# Patient Record
Sex: Female | Born: 1935 | Race: White | Hispanic: No | Marital: Single | Smoking: Former smoker
Health system: Southern US, Community
[De-identification: ages and names within clinical notes are randomized; demographics above are authoritative.]

## PROBLEM LIST (undated history)

## (undated) DIAGNOSIS — K219 Gastro-esophageal reflux disease without esophagitis: Secondary | ICD-10-CM

## (undated) DIAGNOSIS — I4891 Unspecified atrial fibrillation: Secondary | ICD-10-CM

## (undated) DIAGNOSIS — F0280 Dementia in other diseases classified elsewhere without behavioral disturbance: Secondary | ICD-10-CM

## (undated) DIAGNOSIS — I10 Essential (primary) hypertension: Secondary | ICD-10-CM

## (undated) DIAGNOSIS — I63331 Cerebral infarction due to thrombosis of right posterior cerebral artery: Secondary | ICD-10-CM

## (undated) DIAGNOSIS — E785 Hyperlipidemia, unspecified: Secondary | ICD-10-CM

---

## 2011-12-23 DIAGNOSIS — H401190 Primary open-angle glaucoma, unspecified eye, stage unspecified: Secondary | ICD-10-CM | POA: Insufficient documentation

## 2011-12-23 DIAGNOSIS — Z961 Presence of intraocular lens: Secondary | ICD-10-CM | POA: Insufficient documentation

## 2014-04-26 DIAGNOSIS — M1710 Unilateral primary osteoarthritis, unspecified knee: Secondary | ICD-10-CM | POA: Insufficient documentation

## 2014-04-26 DIAGNOSIS — E039 Hypothyroidism, unspecified: Secondary | ICD-10-CM | POA: Insufficient documentation

## 2014-04-26 DIAGNOSIS — M179 Osteoarthritis of knee, unspecified: Secondary | ICD-10-CM | POA: Insufficient documentation

## 2014-06-12 DIAGNOSIS — E785 Hyperlipidemia, unspecified: Secondary | ICD-10-CM | POA: Insufficient documentation

## 2015-03-22 DIAGNOSIS — H353190 Nonexudative age-related macular degeneration, unspecified eye, stage unspecified: Secondary | ICD-10-CM | POA: Insufficient documentation

## 2015-04-03 DIAGNOSIS — M8580 Other specified disorders of bone density and structure, unspecified site: Secondary | ICD-10-CM | POA: Insufficient documentation

## 2015-04-11 DIAGNOSIS — R7301 Impaired fasting glucose: Secondary | ICD-10-CM | POA: Insufficient documentation

## 2015-05-06 DIAGNOSIS — H04123 Dry eye syndrome of bilateral lacrimal glands: Secondary | ICD-10-CM | POA: Insufficient documentation

## 2016-06-17 DIAGNOSIS — I447 Left bundle-branch block, unspecified: Secondary | ICD-10-CM | POA: Insufficient documentation

## 2016-07-24 DIAGNOSIS — H1013 Acute atopic conjunctivitis, bilateral: Secondary | ICD-10-CM | POA: Insufficient documentation

## 2016-08-27 DIAGNOSIS — I2510 Atherosclerotic heart disease of native coronary artery without angina pectoris: Secondary | ICD-10-CM | POA: Insufficient documentation

## 2016-09-08 DIAGNOSIS — I480 Paroxysmal atrial fibrillation: Secondary | ICD-10-CM | POA: Insufficient documentation

## 2016-09-18 DIAGNOSIS — I739 Peripheral vascular disease, unspecified: Secondary | ICD-10-CM | POA: Insufficient documentation

## 2016-11-12 DIAGNOSIS — I119 Hypertensive heart disease without heart failure: Secondary | ICD-10-CM | POA: Insufficient documentation

## 2017-02-09 DIAGNOSIS — Z9189 Other specified personal risk factors, not elsewhere classified: Secondary | ICD-10-CM | POA: Insufficient documentation

## 2017-02-10 DIAGNOSIS — S32009D Unspecified fracture of unspecified lumbar vertebra, subsequent encounter for fracture with routine healing: Secondary | ICD-10-CM | POA: Insufficient documentation

## 2017-03-05 DIAGNOSIS — I10 Essential (primary) hypertension: Secondary | ICD-10-CM | POA: Insufficient documentation

## 2017-08-20 DIAGNOSIS — D509 Iron deficiency anemia, unspecified: Secondary | ICD-10-CM | POA: Insufficient documentation

## 2017-12-17 DIAGNOSIS — H52223 Regular astigmatism, bilateral: Secondary | ICD-10-CM | POA: Insufficient documentation

## 2018-01-03 DIAGNOSIS — H524 Presbyopia: Secondary | ICD-10-CM | POA: Insufficient documentation

## 2018-05-24 DIAGNOSIS — I21A1 Myocardial infarction type 2: Secondary | ICD-10-CM | POA: Insufficient documentation

## 2019-04-05 DIAGNOSIS — I639 Cerebral infarction, unspecified: Secondary | ICD-10-CM | POA: Insufficient documentation

## 2019-04-30 DIAGNOSIS — Z7189 Other specified counseling: Secondary | ICD-10-CM | POA: Insufficient documentation

## 2019-05-31 DIAGNOSIS — R54 Age-related physical debility: Secondary | ICD-10-CM | POA: Insufficient documentation

## 2019-11-28 DIAGNOSIS — Z9181 History of falling: Secondary | ICD-10-CM | POA: Insufficient documentation

## 2019-11-28 DIAGNOSIS — G3184 Mild cognitive impairment, so stated: Secondary | ICD-10-CM | POA: Insufficient documentation

## 2019-11-28 DIAGNOSIS — H5440 Blindness, one eye, unspecified eye: Secondary | ICD-10-CM | POA: Insufficient documentation

## 2019-12-20 DIAGNOSIS — M11839 Other specified crystal arthropathies, unspecified wrist: Secondary | ICD-10-CM | POA: Insufficient documentation

## 2020-06-17 ENCOUNTER — Ambulatory Visit: Payer: Medicare Other | Admitting: Podiatry

## 2020-06-22 HISTORY — PX: ORIF TIBIA & FIBULA FRACTURES: SHX2131

## 2020-08-05 ENCOUNTER — Ambulatory Visit: Payer: Medicare Other | Admitting: Podiatry

## 2020-08-09 ENCOUNTER — Encounter: Payer: Self-pay | Admitting: Sports Medicine

## 2020-08-09 ENCOUNTER — Other Ambulatory Visit: Payer: Self-pay

## 2020-08-09 ENCOUNTER — Ambulatory Visit: Payer: Medicare Other | Admitting: Sports Medicine

## 2020-08-09 DIAGNOSIS — M79674 Pain in right toe(s): Secondary | ICD-10-CM | POA: Diagnosis not present

## 2020-08-09 DIAGNOSIS — H5440 Blindness, one eye, unspecified eye: Secondary | ICD-10-CM | POA: Diagnosis not present

## 2020-08-09 DIAGNOSIS — Z9181 History of falling: Secondary | ICD-10-CM

## 2020-08-09 DIAGNOSIS — B351 Tinea unguium: Secondary | ICD-10-CM | POA: Diagnosis not present

## 2020-08-09 DIAGNOSIS — M79675 Pain in left toe(s): Secondary | ICD-10-CM

## 2020-08-09 NOTE — Progress Notes (Signed)
Subjective: Tammy Jones is a 87 y.o. female patient seen today in office with complaint of mildly painful thickened and elongated toenails; unable to trim. Patient denies history of Diabetes, Neuropathy, or Vascular disease on ASA therapy. Patient has no other pedal complaints at this time.   Patient Active Problem List   Diagnosis Date Noted  . 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 At high risk for falls 11/28/2019  . 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 MCI (mild cognitive impairment) with memory loss 11/28/2019  . Frailty 05/31/2019  . ACP (advance care planning) 04/30/2019  . Occipital stroke (HCC) 04/05/2019  . Myocardial infarction type 2 (HCC) 05/24/2018  . Presbyopia 01/03/2018  . Regular astigmatism of both eyes 12/17/2017  . Iron deficiency anemia 08/20/2017  . Essential hypertension with goal blood pressure less than 130/80 03/05/2017  . Closed fracture of transverse process of lumbar vertebra with routine healing 02/10/2017  . At risk for bleeding associated with anticoagulants 02/09/2017  . Hypertensive heart disease without CHF 11/12/2016  . Peripheral arterial disease (HCC) 09/18/2016  . Paroxysmal atrial fibrillation (HCC) 09/08/2016  . Coronary artery disease involving native coronary artery of native heart without angina pectoris 08/27/2016  . Allergic conjunctivitis of both eyes 07/24/2016  . LBBB (left bundle branch block) 06/17/2016  . Bilateral dry eyes 05/06/2015  . Impaired fasting glucose 04/11/2015  . Osteopenia 04/03/2015  . Nonexudative age-related macular degeneration 03/22/2015  . Hyperlipidemia 06/12/2014  . Acquired hypothyroidism 04/26/2014  . Osteoarthritis of knee 04/26/2014  . Primary open-angle glaucoma 12/23/2011  . Pseudophakia 12/23/2011    Current Outpatient Medications on File Prior to Visit  Medication Sig Dispense Refill  . aspirin 325 MG EC tablet Take by mouth.    Marland Kitchen atorvastatin (LIPITOR) 40 MG tablet Take by  mouth.    . brimonidine (ALPHAGAN) 0.2 % ophthalmic solution Apply to eye.    . carvedilol (COREG) 3.125 MG tablet Take by mouth.    . cyclobenzaprine (FLEXERIL) 10 MG tablet Take by mouth.    . diclofenac Sodium (VOLTAREN) 1 % GEL Apply 2 grams to wrist pain twice daily    . diltiazem (TIAZAC) 180 MG 24 hr capsule Take by mouth.    . dorzolamide-timolol (COSOPT) 22.3-6.8 MG/ML ophthalmic solution Apply to eye.    . escitalopram (LEXAPRO) 5 MG tablet Take 1 tablet by mouth daily.    . ferrous sulfate 325 (65 FE) MG tablet Take 1 tablet by mouth daily.    Marland Kitchen gabapentin (NEURONTIN) 100 MG capsule Take 1 capsule by mouth at bedtime.    Marland Kitchen levothyroxine (SYNTHROID) 75 MCG tablet Take by mouth.    . losartan (COZAAR) 100 MG tablet Take 1 tablet by mouth daily.    . pantoprazole (PROTONIX) 40 MG tablet Take 1 tablet by mouth daily.    . sucralfate (CARAFATE) 1 g tablet Take by mouth.    . diltiazem (CARDIZEM CD) 240 MG 24 hr capsule Take 240 mg by mouth daily.     No current facility-administered medications on file prior to visit.    Allergies  Allergen Reactions  . Apixaban Diarrhea  . Hydrocodone Nausea And Vomiting  . Morphine Other (See Comments)    Hallucinations. Hallucinations.   . Other Rash    LOBSTER    Objective: Physical Exam  General: Well developed, nourished, no acute distress, awake, alert and oriented x 3  Vascular: Dorsalis pedis artery 1/4 bilateral, Posterior tibial artery 1/4 bilateral, skin temperature warm to warm proximal  to distal bilateral lower extremities, + varicosities, no pedal hair present bilateral.  Neurological: Gross sensation present via light touch bilateral.   Dermatological: Skin is warm, dry, and supple bilateral, Nails 1-10 are tender, long, thick, and discolored with mild subungal debris, no webspace macerations present bilateral, no open lesions present bilateral, no callus/corns/hyperkeratotic tissue present bilateral. No signs of  infection bilateral.  Musculoskeletal: Asymptomatic hammertoe boney deformities noted bilateral. Muscular strength within normal limits without painon range of motion. No pain with calf compression bilateral.  Assessment and Plan:  Problem List Items Addressed This Visit      Other   At high risk for falls   Blind right eye    Other Visit Diagnoses    Pain due to onychomycosis of toenails of both feet    -  Primary      -Examined patient.  -Discussed treatment options for painful mycotic nails. -Mechanically debrided and reduced mycotic nails with sterile nail nipper and dremel nail file without incident. -Patient to return in 3 months for follow up evaluation or sooner if symptoms worsen.  Asencion Islam, DPM

## 2020-09-12 DIAGNOSIS — S82852A Displaced trimalleolar fracture of left lower leg, initial encounter for closed fracture: Secondary | ICD-10-CM | POA: Diagnosis not present

## 2020-09-12 DIAGNOSIS — I480 Paroxysmal atrial fibrillation: Secondary | ICD-10-CM | POA: Diagnosis not present

## 2020-09-12 DIAGNOSIS — I6389 Other cerebral infarction: Secondary | ICD-10-CM | POA: Diagnosis not present

## 2020-09-12 DIAGNOSIS — F0390 Unspecified dementia without behavioral disturbance: Secondary | ICD-10-CM

## 2020-09-13 DIAGNOSIS — S82852A Displaced trimalleolar fracture of left lower leg, initial encounter for closed fracture: Secondary | ICD-10-CM | POA: Diagnosis not present

## 2020-09-13 DIAGNOSIS — I480 Paroxysmal atrial fibrillation: Secondary | ICD-10-CM | POA: Diagnosis not present

## 2020-09-13 DIAGNOSIS — F0390 Unspecified dementia without behavioral disturbance: Secondary | ICD-10-CM | POA: Diagnosis not present

## 2020-09-13 DIAGNOSIS — I6389 Other cerebral infarction: Secondary | ICD-10-CM | POA: Diagnosis not present

## 2020-09-14 DIAGNOSIS — I10 Essential (primary) hypertension: Secondary | ICD-10-CM | POA: Diagnosis not present

## 2020-09-14 DIAGNOSIS — S82852A Displaced trimalleolar fracture of left lower leg, initial encounter for closed fracture: Secondary | ICD-10-CM | POA: Diagnosis not present

## 2020-09-14 DIAGNOSIS — I6389 Other cerebral infarction: Secondary | ICD-10-CM

## 2020-09-14 DIAGNOSIS — I480 Paroxysmal atrial fibrillation: Secondary | ICD-10-CM

## 2020-09-22 ENCOUNTER — Emergency Department (HOSPITAL_COMMUNITY): Payer: Medicare Other

## 2020-09-22 ENCOUNTER — Encounter (HOSPITAL_COMMUNITY): Payer: Self-pay

## 2020-09-22 ENCOUNTER — Emergency Department (HOSPITAL_BASED_OUTPATIENT_CLINIC_OR_DEPARTMENT_OTHER): Payer: Medicare Other

## 2020-09-22 ENCOUNTER — Inpatient Hospital Stay (HOSPITAL_COMMUNITY)
Admission: EM | Admit: 2020-09-22 | Discharge: 2020-09-27 | DRG: 176 | Disposition: A | Discharge status: Skilled Nursing Facility | Payer: Medicare Other | Attending: Family Medicine | Admitting: Family Medicine

## 2020-09-22 DIAGNOSIS — I2693 Single subsegmental pulmonary embolism without acute cor pulmonale: Secondary | ICD-10-CM | POA: Diagnosis not present

## 2020-09-22 DIAGNOSIS — Z66 Do not resuscitate: Secondary | ICD-10-CM | POA: Diagnosis present

## 2020-09-22 DIAGNOSIS — Z888 Allergy status to other drugs, medicaments and biological substances status: Secondary | ICD-10-CM

## 2020-09-22 DIAGNOSIS — Z833 Family history of diabetes mellitus: Secondary | ICD-10-CM

## 2020-09-22 DIAGNOSIS — S82892A Other fracture of left lower leg, initial encounter for closed fracture: Secondary | ICD-10-CM

## 2020-09-22 DIAGNOSIS — Z7982 Long term (current) use of aspirin: Secondary | ICD-10-CM

## 2020-09-22 DIAGNOSIS — K219 Gastro-esophageal reflux disease without esophagitis: Secondary | ICD-10-CM | POA: Diagnosis present

## 2020-09-22 DIAGNOSIS — G309 Alzheimer's disease, unspecified: Secondary | ICD-10-CM | POA: Diagnosis not present

## 2020-09-22 DIAGNOSIS — Z79899 Other long term (current) drug therapy: Secondary | ICD-10-CM

## 2020-09-22 DIAGNOSIS — I639 Cerebral infarction, unspecified: Secondary | ICD-10-CM

## 2020-09-22 DIAGNOSIS — I447 Left bundle-branch block, unspecified: Secondary | ICD-10-CM | POA: Diagnosis present

## 2020-09-22 DIAGNOSIS — I2699 Other pulmonary embolism without acute cor pulmonale: Secondary | ICD-10-CM | POA: Diagnosis not present

## 2020-09-22 DIAGNOSIS — I480 Paroxysmal atrial fibrillation: Secondary | ICD-10-CM | POA: Diagnosis present

## 2020-09-22 DIAGNOSIS — E039 Hypothyroidism, unspecified: Secondary | ICD-10-CM | POA: Diagnosis present

## 2020-09-22 DIAGNOSIS — W19XXXD Unspecified fall, subsequent encounter: Secondary | ICD-10-CM | POA: Diagnosis present

## 2020-09-22 DIAGNOSIS — M79662 Pain in left lower leg: Secondary | ICD-10-CM | POA: Diagnosis not present

## 2020-09-22 DIAGNOSIS — F0281 Dementia in other diseases classified elsewhere with behavioral disturbance: Secondary | ICD-10-CM | POA: Diagnosis present

## 2020-09-22 DIAGNOSIS — R410 Disorientation, unspecified: Secondary | ICD-10-CM | POA: Diagnosis not present

## 2020-09-22 DIAGNOSIS — Z91013 Allergy to seafood: Secondary | ICD-10-CM

## 2020-09-22 DIAGNOSIS — Z20822 Contact with and (suspected) exposure to covid-19: Secondary | ICD-10-CM | POA: Diagnosis present

## 2020-09-22 DIAGNOSIS — S8252XD Displaced fracture of medial malleolus of left tibia, subsequent encounter for closed fracture with routine healing: Secondary | ICD-10-CM

## 2020-09-22 DIAGNOSIS — F0391 Unspecified dementia with behavioral disturbance: Secondary | ICD-10-CM | POA: Diagnosis present

## 2020-09-22 DIAGNOSIS — H539 Unspecified visual disturbance: Secondary | ICD-10-CM | POA: Diagnosis present

## 2020-09-22 DIAGNOSIS — I82442 Acute embolism and thrombosis of left tibial vein: Secondary | ICD-10-CM | POA: Diagnosis not present

## 2020-09-22 DIAGNOSIS — Z880 Allergy status to penicillin: Secondary | ICD-10-CM

## 2020-09-22 DIAGNOSIS — I69398 Other sequelae of cerebral infarction: Secondary | ICD-10-CM

## 2020-09-22 DIAGNOSIS — Z885 Allergy status to narcotic agent status: Secondary | ICD-10-CM

## 2020-09-22 DIAGNOSIS — Z79890 Hormone replacement therapy (postmenopausal): Secondary | ICD-10-CM

## 2020-09-22 DIAGNOSIS — G3184 Mild cognitive impairment, so stated: Secondary | ICD-10-CM

## 2020-09-22 DIAGNOSIS — F03918 Unspecified dementia, unspecified severity, with other behavioral disturbance: Secondary | ICD-10-CM | POA: Diagnosis present

## 2020-09-22 DIAGNOSIS — R4182 Altered mental status, unspecified: Secondary | ICD-10-CM | POA: Diagnosis not present

## 2020-09-22 DIAGNOSIS — Z87891 Personal history of nicotine dependence: Secondary | ICD-10-CM

## 2020-09-22 DIAGNOSIS — E785 Hyperlipidemia, unspecified: Secondary | ICD-10-CM | POA: Diagnosis present

## 2020-09-22 DIAGNOSIS — I10 Essential (primary) hypertension: Secondary | ICD-10-CM | POA: Diagnosis present

## 2020-09-22 HISTORY — DX: Gastro-esophageal reflux disease without esophagitis: K21.9

## 2020-09-22 HISTORY — DX: Hyperlipidemia, unspecified: E78.5

## 2020-09-22 HISTORY — DX: Essential (primary) hypertension: I10

## 2020-09-22 HISTORY — DX: Dementia in other diseases classified elsewhere, unspecified severity, without behavioral disturbance, psychotic disturbance, mood disturbance, and anxiety: F02.80

## 2020-09-22 HISTORY — DX: Cerebral infarction due to thrombosis of right posterior cerebral artery: I63.331

## 2020-09-22 HISTORY — DX: Unspecified atrial fibrillation: I48.91

## 2020-09-22 LAB — URINALYSIS, ROUTINE W REFLEX MICROSCOPIC
Bilirubin Urine: NEGATIVE
Glucose, UA: NEGATIVE mg/dL
Ketones, ur: NEGATIVE mg/dL
Nitrite: NEGATIVE
Protein, ur: NEGATIVE mg/dL
Specific Gravity, Urine: 1.018 (ref 1.005–1.030)
pH: 6 (ref 5.0–8.0)

## 2020-09-22 LAB — CBC WITH DIFFERENTIAL/PLATELET
Abs Immature Granulocytes: 0.02 10*3/uL (ref 0.00–0.07)
Basophils Absolute: 0 10*3/uL (ref 0.0–0.1)
Basophils Relative: 1 %
Eosinophils Absolute: 0 10*3/uL (ref 0.0–0.5)
Eosinophils Relative: 0 %
HCT: 29 % — ABNORMAL LOW (ref 36.0–46.0)
Hemoglobin: 9.3 g/dL — ABNORMAL LOW (ref 12.0–15.0)
Immature Granulocytes: 0 %
Lymphocytes Relative: 14 %
Lymphs Abs: 0.9 10*3/uL (ref 0.7–4.0)
MCH: 26.6 pg (ref 26.0–34.0)
MCHC: 32.1 g/dL (ref 30.0–36.0)
MCV: 83.1 fL (ref 80.0–100.0)
Monocytes Absolute: 0.8 10*3/uL (ref 0.1–1.0)
Monocytes Relative: 12 %
Neutro Abs: 5 10*3/uL (ref 1.7–7.7)
Neutrophils Relative %: 73 %
Platelets: 345 10*3/uL (ref 150–400)
RBC: 3.49 MIL/uL — ABNORMAL LOW (ref 3.87–5.11)
RDW: 15.9 % — ABNORMAL HIGH (ref 11.5–15.5)
WBC: 6.8 10*3/uL (ref 4.0–10.5)
nRBC: 0 % (ref 0.0–0.2)

## 2020-09-22 LAB — COMPREHENSIVE METABOLIC PANEL
ALT: 30 U/L (ref 0–44)
AST: 32 U/L (ref 15–41)
Albumin: 2.1 g/dL — ABNORMAL LOW (ref 3.5–5.0)
Alkaline Phosphatase: 97 U/L (ref 38–126)
Anion gap: 7 (ref 5–15)
BUN: 12 mg/dL (ref 8–23)
CO2: 23 mmol/L (ref 22–32)
Calcium: 8.1 mg/dL — ABNORMAL LOW (ref 8.9–10.3)
Chloride: 105 mmol/L (ref 98–111)
Creatinine, Ser: 0.88 mg/dL (ref 0.44–1.00)
GFR, Estimated: >60 mL/min (ref >60)
Glucose, Bld: 125 mg/dL — ABNORMAL HIGH (ref 70–99)
Potassium: 3.6 mmol/L (ref 3.5–5.1)
Sodium: 135 mmol/L (ref 135–145)
Total Bilirubin: 0.7 mg/dL (ref 0.3–1.2)
Total Protein: 6.1 g/dL — ABNORMAL LOW (ref 6.5–8.1)

## 2020-09-22 LAB — LACTIC ACID, PLASMA: Lactic Acid, Venous: 0.9 mmol/L (ref 0.5–1.9)

## 2020-09-22 LAB — CBG MONITORING, ED: Glucose-Capillary: 125 mg/dL — ABNORMAL HIGH (ref 70–99)

## 2020-09-22 LAB — D-DIMER, QUANTITATIVE: D-Dimer, Quant: 3.81 ug/mL-FEU — ABNORMAL HIGH (ref 0.00–0.50)

## 2020-09-22 LAB — TROPONIN I (HIGH SENSITIVITY)
Troponin I (High Sensitivity): 9 ng/L (ref <18)
Troponin I (High Sensitivity): 9 ng/L (ref <18)

## 2020-09-22 MED ORDER — IOHEXOL 350 MG/ML SOLN
75.0000 mL | Freq: Once | INTRAVENOUS | Status: AC | PRN
  Administered 2020-09-22: 75 mL via INTRAVENOUS

## 2020-09-22 MED ORDER — ESCITALOPRAM OXALATE 10 MG PO TABS
5.0000 mg | ORAL_TABLET | Freq: Every morning | ORAL | Status: DC
  Administered 2020-09-23 – 2020-09-27 (×5): 5 mg via ORAL
  Filled 2020-09-22 (×5): qty 1

## 2020-09-22 MED ORDER — CLOPIDOGREL BISULFATE 75 MG PO TABS: 75.0000 mg | ORAL_TABLET | Freq: Every morning | ORAL | Status: DC

## 2020-09-22 MED ORDER — ASPIRIN EC 81 MG PO TBEC: 81.0000 mg | DELAYED_RELEASE_TABLET | Freq: Every morning | ORAL | Status: DC

## 2020-09-22 MED ORDER — APIXABAN 5 MG PO TABS
5.0000 mg | ORAL_TABLET | Freq: Once | ORAL | Status: AC
  Administered 2020-09-22: 5 mg via ORAL
  Filled 2020-09-22: qty 1

## 2020-09-22 MED ORDER — DILTIAZEM HCL ER COATED BEADS 240 MG PO CP24
240.0000 mg | ORAL_CAPSULE | Freq: Every morning | ORAL | Status: DC
  Administered 2020-09-23 – 2020-09-27 (×5): 240 mg via ORAL
  Filled 2020-09-22 (×7): qty 1

## 2020-09-22 MED ORDER — ACETAMINOPHEN 650 MG RE SUPP: 650.0000 mg | Freq: Four times a day (QID) | RECTAL | Status: DC | PRN

## 2020-09-22 MED ORDER — APIXABAN 5 MG PO TABS
10.0000 mg | ORAL_TABLET | Freq: Two times a day (BID) | ORAL | Status: DC
  Administered 2020-09-23 – 2020-09-27 (×9): 10 mg via ORAL
  Filled 2020-09-22 (×9): qty 2

## 2020-09-22 MED ORDER — ATORVASTATIN CALCIUM 40 MG PO TABS
40.0000 mg | ORAL_TABLET | Freq: Every day | ORAL | Status: DC
  Administered 2020-09-22 – 2020-09-27 (×6): 40 mg via ORAL
  Filled 2020-09-22 (×2): qty 1
  Filled 2020-09-22: qty 4
  Filled 2020-09-22 (×3): qty 1

## 2020-09-22 MED ORDER — APIXABAN 2.5 MG PO TABS: 2.5000 mg | ORAL_TABLET | Freq: Two times a day (BID) | ORAL | Status: DC

## 2020-09-22 MED ORDER — APIXABAN 5 MG PO TABS: 5.0000 mg | ORAL_TABLET | Freq: Two times a day (BID) | ORAL | Status: DC

## 2020-09-22 MED ORDER — LEVOTHYROXINE SODIUM 75 MCG PO TABS
75.0000 ug | ORAL_TABLET | Freq: Every day | ORAL | Status: DC
  Administered 2020-09-23 – 2020-09-27 (×5): 75 ug via ORAL
  Filled 2020-09-22 (×5): qty 1

## 2020-09-22 MED ORDER — LORAZEPAM 2 MG/ML IJ SOLN: 1.0000 mg | Freq: Once | INTRAMUSCULAR | Status: DC | PRN

## 2020-09-22 MED ORDER — ACETAMINOPHEN 325 MG PO TABS
650.0000 mg | ORAL_TABLET | Freq: Four times a day (QID) | ORAL | Status: DC | PRN
  Administered 2020-09-23 – 2020-09-26 (×6): 650 mg via ORAL
  Filled 2020-09-22 (×6): qty 2

## 2020-09-22 MED ORDER — APIXABAN 5 MG PO TABS
5.0000 mg | ORAL_TABLET | Freq: Two times a day (BID) | ORAL | Status: DC
  Administered 2020-09-22: 5 mg via ORAL
  Filled 2020-09-22: qty 1

## 2020-09-22 MED ORDER — BRIMONIDINE TARTRATE 0.2 % OP SOLN
1.0000 [drp] | Freq: Every morning | OPHTHALMIC | Status: DC
  Administered 2020-09-23 – 2020-09-27 (×5): 1 [drp] via OPHTHALMIC
  Filled 2020-09-22: qty 5

## 2020-09-22 MED ORDER — PANTOPRAZOLE SODIUM 40 MG PO TBEC
40.0000 mg | DELAYED_RELEASE_TABLET | Freq: Every morning | ORAL | Status: DC
  Administered 2020-09-23 – 2020-09-27 (×5): 40 mg via ORAL
  Filled 2020-09-22 (×5): qty 1

## 2020-09-22 MED ORDER — GABAPENTIN 100 MG PO CAPS
100.0000 mg | ORAL_CAPSULE | Freq: Every morning | ORAL | Status: DC
  Administered 2020-09-23 – 2020-09-27 (×5): 100 mg via ORAL
  Filled 2020-09-22 (×5): qty 1

## 2020-09-22 MED ORDER — SUCRALFATE 1 GM/10ML PO SUSP
1.0000 g | Freq: Two times a day (BID) | ORAL | Status: DC
  Administered 2020-09-22 – 2020-09-27 (×10): 1 g via ORAL
  Filled 2020-09-22 (×11): qty 10

## 2020-09-22 MED ORDER — LOSARTAN POTASSIUM 50 MG PO TABS
100.0000 mg | ORAL_TABLET | Freq: Every morning | ORAL | Status: DC
  Administered 2020-09-23 – 2020-09-27 (×5): 100 mg via ORAL
  Filled 2020-09-22 (×5): qty 2

## 2020-09-22 MED ORDER — VITAMIN D 25 MCG (1000 UNIT) PO TABS
50000.0000 [IU] | ORAL_TABLET | ORAL | Status: DC
  Filled 2020-09-22: qty 50

## 2020-09-22 MED ORDER — ONDANSETRON HCL 4 MG PO TABS: 4.0000 mg | ORAL_TABLET | Freq: Four times a day (QID) | ORAL | Status: DC | PRN

## 2020-09-22 MED ORDER — DORZOLAMIDE HCL-TIMOLOL MAL 2-0.5 % OP SOLN
1.0000 [drp] | Freq: Every morning | OPHTHALMIC | Status: DC
  Administered 2020-09-23 – 2020-09-27 (×5): 1 [drp] via OPHTHALMIC
  Filled 2020-09-22 (×2): qty 10

## 2020-09-22 MED ORDER — ONDANSETRON HCL 4 MG/2ML IJ SOLN: 4.0000 mg | Freq: Four times a day (QID) | INTRAMUSCULAR | Status: DC | PRN

## 2020-09-22 NOTE — ED Provider Notes (Signed)
  Physical Exam  BP (!) 145/65 (BP Location: Right Arm)   Pulse 66   Temp 98.5 F (36.9 C) (Oral)   Resp 12   SpO2 98%   Physical Exam  ED Course/Procedures   Clinical Course as of 09/22/20 2209  Sun Sep 22, 2020  2013 I spent quite some time talking to the patient's daughter.  I assured her that we had done a thorough job of evaluating the patient for any acute medical issues that could be contributing to delirium.  I think the patient's delirium is likely secondary to her recent surgery and also progression of dementia.  The patient is also in a new environment, and this could be contributing to her symptoms.  Nonetheless, the daughter is very worried about the patient's symptoms as she has been hallucinating.  She does not believe the skilled nursing facility has the capacity to fully monitor, evaluate, and treat the symptoms.  The next step in this case, would be to consult behavioral health for recommendations. [AW]    Clinical Course User Index [AW] Koleen Distance, MD    Procedures  MDM   Received care of patient from Dr. Delford Field.  Please see her note for prior history, physical and care.  Briefly this is a 87 year old female with a history of Alzheimer's, atrial fibrillation, CVA hypertension, hyperlipidemia, who presents with concern for hallucinations.  She has had a thorough evaluation including CT head, DVT study which showed evolution of known infarct, and DVT study positive for thrombus.  Agree that it is very likely her delirium is secondary to progression of her dementia in the setting of recent stroke, new environment.  Given her positive D-dimer, and intermittent changes in oxygenation on differential for cause of altered mental status, ordered a CT PE study for further evaluation.  CT shows small PE without evidence of right heart strain.  She has already received Eliquis, has no sign of right heart strain.  Will admit to the hospitalist for echo and monitoring for other  vital sign changes and possibility that this contributes to delirium, however it is likely her current symptoms have multifactorial origins including baseline dementia, recent hospitalizations, new environments and stroke.      Alvira Monday, MD 09/23/20 [phone]

## 2020-09-22 NOTE — BH Assessment (Addendum)
Comprehensive Clinical Assessment (CCA) Note  09/22/2020 Tammy Jones [id number]  Recommendations for Services/Supports/Treatments: Melbourne Abts, PA, reviewed pt's chart and information and determined pt should be observed overnight for safety and stability and re-assessed in the morning by psychiatry. Pt will remain at [facility] for observation, as the [facility] has no appropriate bed for pt. Pt's EDP, Dr. Dalene Seltzer, and nurse, Tammy Bonds RN, were relayed this information via secure internal IM at 2149.  The patient demonstrates the following risk factors for suicide: Chronic risk factors for suicide include: medical illness of dementia. Acute risk factors for suicide include: pt placement at a Skilled Living Facility. Protective factors for this patient include: positive social support, positive therapeutic relationship and coping skills. Considering these factors, the overall suicide risk at this point appears to be none. Patient is not appropriate for outpatient follow up.  Therefore, no sitter for suicide precautions are identified at this time.  Flowsheet Row ED from 09/22/2020 in [facility] EMERGENCY DEPARTMENT  C-SSRS RISK CATEGORY No Risk      Chief Complaint:  Chief Complaint  Patient presents with  . Altered Mental Status   Visit Diagnosis: F05, Delirium due to another medical condition  CCA Screening, Triage and Referral (STR) Tammy Jones is an 87 year old patient who was brought to the [facility] via EMS after her daughter called 911 due to concerns re: her mother acting differently than she usually. Pt has been dx with dementia and she shares she's been experiencing AVH since her last series of strokes. Pt states, "I have spells where I don't remember stuff." Of the AVH, when asked if they were bothering her, she states, "they're just the nicest people."  Pt shares she lives with her daughter. She identifies she recently fell and broke part of her leg or ankle. When asked  how her progress with recovery has been going, she stated, "hopefully it's doing ok; I've got stitches up the wazoo." Pt was able to identify that her daughter lives close to the Skilled Nursing Facility she currently resides in; she states her daughter visits frequently but was unable to identify who visited her today (it was her daughter, who called 911 due to concerns re: pt's dementia).  Pt denies SI or any hx of SI. She denies any prior attempts to kill herself or any current plan to kill herself. Pt denies HI, NSSIB, access to guns/weapons, engagement with the legal system, or SA. Pt confirms she's experiencing "a little bit of both" audio and visual hallucinations. She shares this has been ongoing since her second series of strokes.  Pt was able to identify her name and why she was in the hospital. When asked, she stated she thought today was Nov 11, 2018, though she acknowledged this was "just a wild guess." When asked where she currently was, pt stated, "I know it's [place]." When clinician asked again a moment later, pt stated she thought she was in [place], [place]. Pt was unable to identify who the President of the [place] currently is.   Patient Reported Information How did you hear about [place]? Other (Comment) (EDP)  Referral name: EDP  Referral phone number: 0 (N/A)   Whom do you see for routine medical problems? -- (Not assessed)  Practice/Facility Name: No data recorded Practice/Facility Phone Number: No data recorded Name of Contact: No data recorded Contact Number: No data recorded Contact Fax Number: No data recorded Prescriber Name: No data recorded Prescriber Address (if known): No data recorded  What Is the  Reason for Your Visit/Call Today? Pt shares she believes she had another stroke. She confirms she's been experiencing AVH since her last stroke.  How Long Has This Been Causing You Problems? 1-6 months  What Do You Feel Would Help You the Most Today?  Medication(s)   Have You Recently Been in Any Inpatient Treatment (Hospital/Detox/Crisis Center/28-Day Program)? No  Name/Location of Program/Hospital:No data recorded How Long Were You There? No data recorded When Were You Discharged? No data recorded  Have You Ever Received Services From [facility] Before? Yes  Who Do You See at [employer] [facility]? DPM Stover in Podiatry for toenail pain on 08/09/2020   Have You Recently Had Any Thoughts About Hurting Yourself? No  Are You Planning to Commit Suicide/Harm Yourself At This time? No   Have you Recently Had Thoughts About Hurting Someone Karolee Ohs? No  Explanation: No data recorded  Have You Used Any Alcohol or Drugs in the Past 24 Hours? No  How Long Ago Did You Use Drugs or Alcohol? No data recorded What Did You Use and How Much? No data recorded  Do You Currently Have a Therapist/Psychiatrist? No  Name of Therapist/Psychiatrist: No data recorded  Have You Been Recently Discharged From Any Office Practice or Programs? No  Explanation of Discharge From Practice/Program: No data recorded    CCA Screening Triage Referral Assessment Type of Contact: Tele-Assessment  Is this Initial or Reassessment? Initial Assessment  Date Telepsych consult ordered in [facility]:  09/22/2020  Time Telepsych consult ordered in [facility]:  2014   Patient Reported Information Reviewed? Yes  Patient Left Without Being Seen? No data recorded Reason for Not Completing Assessment: No data recorded  Collateral Involvement: Verbal consent for collateral was not obtained.   Does Patient Have a Automotive engineer Guardian? No data recorded Name and Contact of Legal Guardian: No data recorded If Minor and Not Living with Parent(s), Who has Custody? N/A  Is CPS involved or ever been involved? Never  Is APS involved or ever been involved? Never   Patient Determined To Be At Risk for Harm To Self or Others Based on Review of Patient Reported Information or  Presenting Complaint? No  Method: No data recorded Availability of Means: No data recorded Intent: No data recorded Notification Required: No data recorded Additional Information for Danger to Others Potential: No data recorded Additional Comments for Danger to Others Potential: No data recorded Are There Guns or Other Weapons in Your Home? No data recorded Types of Guns/Weapons: No data recorded Are These Weapons Safely Secured?                            No data recorded Who Could Verify You Are Able To Have These Secured: No data recorded Do You Have any Outstanding Charges, Pending Court Dates, Parole/Probation? No data recorded Contacted To Inform of Risk of Harm To Self or Others: Other: Comment (N/A)   Location of Assessment: [facility] ED   Does Patient Present under Involuntary Commitment? No  IVC Papers Initial File Date: No data recorded  [place] of Residence: [place]   Patient Currently Receiving the Following Services: Not Receiving Services   Determination of Need: Routine (7 days)   Options For Referral: Other: Comment (Overnight observation at [facility])     CCA Biopsychosocial Intake/Chief Complaint:  Pt shares she believes she had another stroke. She confirms she's been experiencing AVH since her last stroke.  Current Symptoms/Problems: Pt shares she's  been experiencing AVH since her last series of strokes. She states, "they're just the nicest people," in regards to the hallucinations.   Patient Reported Schizophrenia/Schizoaffective Diagnosis in Past: No   Strengths: Not assessed  Preferences: Not assessed  Abilities: Not assessed   Type of Services Patient Feels are Needed: Not assessed   Initial Clinical Notes/Concerns: N/A   Mental Health Symptoms Depression:  None   Duration of Depressive symptoms: No data recorded  Mania:  None   Anxiety:   None   Psychosis:  None   Duration of Psychotic symptoms: No data recorded  Trauma:  None    Obsessions:  None   Compulsions:  None   Inattention:  None   Hyperactivity/Impulsivity:  N/A   Oppositional/Defiant Behaviors:  None   Emotional Irregularity:  None   Other Mood/Personality Symptoms:  None noted    Mental Status Exam Appearance and self-care  Stature:  Small   Weight:  Average weight   Clothing:  Neat/clean   Grooming:  Normal   Cosmetic use:  None   Posture/gait:  Normal   Motor activity:  Not Remarkable   Sensorium  Attention:  Normal   Concentration:  Normal   Orientation:  Situation; Person   Recall/memory:  Defective in Short-term; Defective in Recent; Defective in Remote   Affect and Mood  Affect:  Appropriate   Mood:  Other (Comment) (Friendly)   Relating  Eye contact:  Normal   Facial expression:  Responsive   Attitude toward examiner:  Cooperative   Thought and Language  Speech flow: Clear and Coherent   Thought content:  Appropriate to Mood and Circumstances   Preoccupation:  None   Hallucinations:  Auditory; Visual   Organization:  No data recorded  [employer] of Knowledge:  Average   Intelligence:  Average   Abstraction:  Normal   Judgement:  Good   Reality Testing:  Variable   Insight:  Good   Decision Making:  Normal   Social Functioning  Social Maturity:  Responsible   Social Judgement:  Normal   Stress  Stressors:  Housing; Illness; Transitions   Coping Ability:  Normal   Skill Deficits:  Activities of daily living; Self-care (Note: this is due to age & pt's recently broken left tibia and fibula.)   Supports:  Family; Friends/Service system     Religion: Religion/Spirituality Are You A Religious Person?:  (Not assessed) How Might This Affect Treatment?: Not assessed  Leisure/Recreation: Leisure / Recreation Do You Have Hobbies?:  (Not assessed)  Exercise/Diet: Exercise/Diet Do You Exercise?:  (Not assessed) Have You Gained or Lost A Significant Amount of Weight  in the Past Six Months?:  (Not assessed) Do You Follow a Special Diet?:  (Not assessed) Do You Have Any Trouble Sleeping?:  (Not assessed)   CCA Employment/Education Employment/Work Situation: Employment / Work Situation Employment situation: Retired Psychologist, 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 been impacted by current illness:  (N/A) What is the longest time patient has a held a job?: Not assessed Where was the patient employed at that time?: Not assessed Has patient ever been in the [employer]?:  (Not assessed)  Education: Education Is Patient Currently Attending School?: No Last Grade Completed:  (Not assessed) Name of High School: Not assessed Did Garment/textile technologist From [employer]?:  (Not assessed) Did Theme park manager?:  (Not assessed) Did You Attend Graduate School?:  (Not assessed) Did You Have Any Special Interests In School?: Not assessed Did You Have An Individualized Education Program (IIEP):  (  Not assessed) Did You Have Any Difficulty At School?:  (Not assessed) Patient's Education Has Been Impacted by Current Illness:  (Not assessed)   CCA Family/Childhood History Family and Relationship History: Family history Marital status:  (Not assessed) Are you sexually active?:  (Not assessed) What is your sexual orientation?: Not assessed Has your sexual activity been affected by drugs, alcohol, medication, or emotional stress?: Not assessed Does patient have children?: Yes How many children?: 1 (Pt has at least one child, a daughter) How is patient's relationship with their children?: Pt lives with her daughter when she is not in the Skilled Nursing Facility  Childhood History:  Childhood History By whom was/is the patient raised?:  (Not assessed) Additional childhood history information: Not assessed Description of patient's relationship with caregiver when they were a child: Not assessed Patient's description of current relationship with people who raised him/her: Not assessed How were you  disciplined when you got in trouble as a child/adolescent?: Not assessed Does patient have siblings?:  (Not assessed) Did patient suffer any verbal/emotional/physical/sexual abuse as a child?:  (Not assessed) Did patient suffer from severe childhood neglect?:  (Not assessed) Has patient ever been sexually abused/assaulted/raped as an adolescent or adult?:  (Not assessed) Was the patient ever a victim of a crime or a disaster?:  (Not assessed) Witnessed domestic violence?:  (Not assessed) Has patient been affected by domestic violence as an adult?:  (Not assessed)  Child/Adolescent Assessment:     CCA Substance Use Alcohol/Drug Use: Alcohol / Drug Use Pain Medications: Please see MAR Prescriptions: Please see MAR Over the Counter: Please see MAR History of alcohol / drug use?: No history of alcohol / drug abuse Longest period of sobriety (when/how long): N/A Negative Consequences of Use:  (N/A) Withdrawal Symptoms:  (N/A)                         ASAM's:  Six Dimensions of Multidimensional Assessment  Dimension 1:  Acute Intoxication and/or Withdrawal Potential:      Dimension 2:  Biomedical Conditions and Complications:      Dimension 3:  Emotional, Behavioral, or Cognitive Conditions and Complications:     Dimension 4:  Readiness to Change:     Dimension 5:  Relapse, Continued use, or Continued Problem Potential:     Dimension 6:  Recovery/Living Environment:     ASAM Severity Score:    ASAM Recommended Level of Treatment:     Substance use Disorder (SUD) Substance Use Disorder (SUD)  Checklist Symptoms of Substance Use:  (N/A)  Recommendations for Services/Supports/Treatments: Recommendations for Services/Supports/Treatments Recommendations For Services/Supports/Treatments: Other (Comment) (Overnight observation)  [place] Abts, PA, reviewed pt's chart and information and determined pt should be observed overnight for safety and stability and re-assessed in the  morning by psychiatry. Pt will remain at [facility] for observation, as the [facility] has no appropriate bed for pt. Pt's EDP, Dr. Dalene Seltzer, and nurse, Tammy Bonds RN, were relayed this information via secure internal IM at 2149.   DSM5 Diagnoses: Patient Active Problem List   Diagnosis Date Noted  . 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 At high risk for falls 11/28/2019  . 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 MCI (mild cognitive impairment) with memory loss 11/28/2019  . Frailty 05/31/2019  . ACP (advance care planning) 04/30/2019  . Occipital stroke (HCC) 04/05/2019  . Myocardial infarction type 2 (HCC) 05/24/2018  . Presbyopia 01/03/2018  . Regular astigmatism of both eyes 12/17/2017  .  Iron deficiency anemia 08/20/2017  . Essential hypertension with goal blood pressure less than 130/80 03/05/2017  . Closed fracture of transverse process of lumbar vertebra with routine healing 02/10/2017  . At risk for bleeding associated with anticoagulants 02/09/2017  . Hypertensive heart disease without CHF 11/12/2016  . Peripheral arterial disease (HCC) 09/18/2016  . Paroxysmal atrial fibrillation (HCC) 09/08/2016  . Coronary artery disease involving native coronary artery of native heart without angina pectoris 08/27/2016  . Allergic conjunctivitis of both eyes 07/24/2016  . LBBB (left bundle branch block) 06/17/2016  . Bilateral dry eyes 05/06/2015  . Impaired fasting glucose 04/11/2015  . Osteopenia 04/03/2015  . Nonexudative age-related macular degeneration 03/22/2015  . Hyperlipidemia 06/12/2014  . Acquired hypothyroidism 04/26/2014  . Osteoarthritis of knee 04/26/2014  . Primary open-angle glaucoma 12/23/2011  . Pseudophakia 12/23/2011    Patient Centered Plan: Patient is on the following Treatment Plan(s): none   Referrals to Alternative Service(s): Referred to Alternative Service(s):   Place:   Date:   Time:    Referred to Alternative Service(s):   Place:   Date:   Time:     Referred to Alternative Service(s):   Place:   Date:   Time:    Referred to Alternative Service(s):   Place:   Date:   Time:     Ralph DowdySamantha L Lita Flynn, LMFT

## 2020-09-22 NOTE — Progress Notes (Signed)
Lower extremity venous LT study completed.  Preliminary results relayed to Delford Field, MD.   See CV Proc for preliminary results report.   Jean Rosenthal, RDMS, RVT

## 2020-09-22 NOTE — H&P (Signed)
History and Physical    [place] YOV:[id number] DOB: December 11, 1931 DOA: 09/22/2020  PCP: Everlean Cherry, MD  Patient coming from: SNF  I have personally briefly reviewed patient's old medical records in [facility]  Chief Complaint: Hallucinations  HPI: Tammy Jones is a 87 y.o. female with medical history significant of Dementia, prior strokes, PAF not on anticoagulation anymore due to frequent falls, HTN.  3 weeks ago pt living with daughter, would ask repetitive questions and have sundowning at night however not terribly impaired from dementia standpoint.  She ambulated unassisted.  Pt then suffered mechanical fall and broke L TIB-FIB.  She was admitted to [facility] and had ORIF.  Did have some delirium in post-op course and reported visual hallucinations.  Pt discharged to SNF, had reportedly been doing well at SNF.  Was reportedly normal when granddaughter saw her last night.  This morning however, patients daughter went to visit patient and patient was talking to people in room that wernt there.  Daughter called EMS.  Pts mental status has been waxing and waning since then.  Pt denies: abd pain, SOB, fever, palpitations, rash, seizures, slurred speech.   ED Course: W/U in ED shows small pulmonary emboli on CTA in RUL.  CT head shows old strokes that correlate to the (then subacute) stroke seen back in Oct 2020.  HGB 9.3  EDP gave pt dose of eliquis.  TTS consulted, hospitalist asked to admit due to presence of PE.   Review of Systems: As per HPI, otherwise all review of systems negative.  Past Medical History:  Diagnosis Date  . Alzheimer's disease (HCC)   . Atrial fibrillation (HCC)   . 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 GERD (gastroesophageal reflux disease)   . Hyperlipidemia   . Hypertension     Past Surgical History:  Procedure Laterality Date  . ORIF TIBIA & FIBULA FRACTURES Left 2022     reports  that she has quit smoking. She has a 10.00 pack-year smoking history. She does not have any smokeless tobacco history on file. She reports that she does not drink alcohol and does not use drugs.  Allergies  Allergen Reactions  . Apixaban Diarrhea  . Hydrocodone Nausea And Vomiting  . Morphine Other (See Comments)    Hallucinations  . Penicillins Other (See Comments)    "Allergic," per MAR  . Other Rash    LOBSTER    Family History  Problem Relation Age of Onset  . Diabetes Sister   . Stroke Neg Hx      Prior to Admission medications   Medication Sig Start Date End Date Taking? Authorizing Provider  aspirin EC 81 MG tablet Take 81 mg by mouth in the morning. Swallow whole.   Yes [provider]  atorvastatin (LIPITOR) 40 MG tablet Take 40 mg by mouth daily. 06/26/19  Yes [provider]  brimonidine (ALPHAGAN) 0.2 % ophthalmic solution Place 1 drop into both eyes in the morning. 07/03/19  Yes [provider]  Cholecalciferol (VITAMIN D3) 1.25 MG (50000 UT) CAPS Take 50,000 Units by mouth every Tuesday.   Yes [provider]  clopidogrel (PLAVIX) 75 MG tablet Take 75 mg by mouth in the morning.   Yes [provider]  diltiazem (CARDIZEM LA) 240 MG 24 hr tablet Take 240 mg by mouth in the morning.   Yes [provider]  dorzolamide-timolol (COSOPT) 22.3-6.8 MG/ML ophthalmic solution Place 1 drop into both  eyes in the morning. 07/03/19  Yes [provider]  escitalopram (LEXAPRO) 5 MG tablet Take 5 mg by mouth in the morning. 02/20/20  Yes [provider]  gabapentin (NEURONTIN) 100 MG capsule Take 100 mg by mouth in the morning. 06/26/19  Yes [provider]  levothyroxine (SYNTHROID) 75 MCG tablet Take 75 mcg by mouth daily before breakfast. 07/05/19  Yes [provider]  losartan (COZAAR) 100 MG tablet Take 100 mg by mouth in the morning. 03/20/19  Yes [provider]  oxyCODONE (OXY  IR/ROXICODONE) 5 MG immediate release tablet Take 5 mg by mouth every 4 (four) hours as needed for moderate pain or severe pain.  09/22/20 Yes [provider]  pantoprazole (PROTONIX) 40 MG tablet Take 40 mg by mouth in the morning. 06/26/19  Yes [provider]  sucralfate (CARAFATE) 1 GM/10ML suspension Take 1 g by mouth in the morning and at bedtime.   Yes [provider]    Physical Exam: Vitals:   09/22/20 2215 09/22/20 2230 09/22/20 2300 09/22/20 2310  BP: (!) 148/68 (!) 147/66 (!) 163/68   Pulse: 68 63 65 69  Resp: (!) 22 20 18 18   Temp:      TempSrc:      SpO2: 97% 98% 97% 99%    Constitutional: NAD, calm, comfortable Eyes: PERRL, lids and conjunctivae normal ENMT: Mucous membranes are moist. Posterior pharynx clear of any exudate or lesions.Normal dentition.  Neck: normal, supple, no masses, no thyromegaly Respiratory: clear to auscultation bilaterally, no wheezing, no crackles. Normal respiratory effort. No accessory muscle use.  Cardiovascular: Regular rate and rhythm, no murmurs / rubs / gallops. No extremity edema. 2+ pedal pulses. No carotid bruits.  Abdomen: no tenderness, no masses palpated. No hepatosplenomegaly. Bowel sounds positive.  Musculoskeletal: no clubbing / cyanosis. No joint deformity upper and lower extremities. Good ROM, no contractures. Normal muscle tone.  Skin:    Neurologic: CN 2-12 grossly intact. Sensation intact, DTR normal. Strength 5/5 in all 4.  Psychiatric: Normal judgment and insight. Oriented to person and place.   Labs on Admission: I have personally reviewed following labs and imaging studies  CBC: Recent Labs  Lab 09/22/20 1352  WBC 6.8  NEUTROABS 5.0  HGB 9.3*  HCT 29.0*  MCV 83.1  PLT 345   Basic Metabolic Panel: Recent Labs  Lab 09/22/20 1352  NA 135  K 3.6  CL 105  CO2 23  GLUCOSE 125*  BUN 12  CREATININE 0.88  CALCIUM 8.1*   GFR: CrCl cannot be calculated (Unknown ideal  weight.). Liver Function Tests: Recent Labs  Lab 09/22/20 1352  AST 32  ALT 30  ALKPHOS 97  BILITOT 0.7  PROT 6.1*  ALBUMIN 2.1*   No results for input(s): LIPASE, AMYLASE in the last 168 hours. No results for input(s): AMMONIA in the last 168 hours. Coagulation Profile: No results for input(s): INR, PROTIME in the last 168 hours. Cardiac Enzymes: No results for input(s): CKTOTAL, CKMB, CKMBINDEX, TROPONINI in the last 168 hours. BNP (last 3 results) No results for input(s): PROBNP in the last 8760 hours. HbA1C: No results for input(s): HGBA1C in the last 72 hours. CBG: Recent Labs  Lab 09/22/20 1251  GLUCAP 125*   Lipid Profile: No results for input(s): CHOL, HDL, LDLCALC, TRIG, CHOLHDL, LDLDIRECT in the last 72 hours. Thyroid Function Tests: No results for input(s): TSH, T4TOTAL, FREET4, T3FREE, THYROIDAB in the last 72 hours. Anemia Panel: No results for input(s): VITAMINB12, FOLATE, FERRITIN,  TIBC, IRON, RETICCTPCT in the last 72 hours. Urine analysis:    Component Value Date/Time   COLORURINE YELLOW 09/22/2020 1937   APPEARANCEUR CLEAR 09/22/2020 1937   LABSPEC 1.018 09/22/2020 1937   PHURINE 6.0 09/22/2020 1937   GLUCOSEU NEGATIVE 09/22/2020 1937   HGBUR SMALL (A) 09/22/2020 1937   BILIRUBINUR NEGATIVE 09/22/2020 1937   KETONESUR NEGATIVE 09/22/2020 1937   PROTEINUR NEGATIVE 09/22/2020 1937   NITRITE NEGATIVE 09/22/2020 1937   LEUKOCYTESUR TRACE (A) 09/22/2020 1937    Radiological Exams on Admission: DG Wrist Complete Left  Result Date: 09/22/2020 CLINICAL DATA:  Fall 1 week ago. EXAM: LEFT WRIST - COMPLETE 3+ VIEW COMPARISON:  12/19/2019 FINDINGS: Moderate degenerative changes of the radiocarpal joint and distal radioulnar joint as well as the carpal bones and first carpometacarpal joints. Degenerative change over the first MTP joint and interphalangeal joint. No acute fracture or dislocation. IMPRESSION: No acute findings. Electronically Signed   By: Elberta Fortis M.D.   On: 09/22/2020 15:07   DG Ankle Complete Left  Result Date: 09/22/2020 CLINICAL DATA:  Fall 1 week ago with left ankle pain. EXAM: LEFT ANKLE COMPLETE - 3+ VIEW COMPARISON:  09/04/2020 and 09/06/2020 FINDINGS: Fixation hardware unchanged in intact bridging patient's distal fibular fracture and medial malleolar fracture. Single screw unchanged bridging the distal tibia/fibular joint. Remainder of the exam is unchanged. IMPRESSION: No acute findings. Electronically Signed   By: Elberta Fortis M.D.   On: 09/22/2020 15:10   CT Head Wo Contrast  Result Date: 09/22/2020 CLINICAL DATA:  Mental status change. EXAM: CT HEAD WITHOUT CONTRAST TECHNIQUE: Contiguous axial images were obtained from the base of the skull through the vertex without intravenous contrast. COMPARISON:  September 12, 2020. FINDINGS: Brain: Evolving encephalomalacia in the right occipital and medial temporal lobe. No definite new acute large vascular territory infarct. No acute hemorrhage. Similar remote left PCA and left PICA territory infarcts and chronic lacunar infarcts in bilateral thalami and right cerebellum. No midline shift. No hydrocephalus. Additional patchy white matter hypoattenuation, nonspecific but most likely related to chronic microvascular ischemic disease. Vascular: Calcific atherosclerosis. No hyperdense vessel identified. Skull: No acute fracture. Sinuses/Orbits: No acute findings. Other: No mastoid effusions. IMPRESSION: 1. Evolving encephalomalacia in the right occipital and medial temporal lobes, corresponding to areas of infarcts seen on recent MRI. No evidence of interval acute intracranial abnormality, although MRI could provide more sensitive evaluation for interval acute infarct if clinically indicated. 2. Similar remote left PCA and left PICA territory infarcts and chronic lacunar infarcts in bilateral thalami and right cerebellum. Electronically Signed   By: Feliberto Harts MD   On: 09/22/2020 16:55    CT Angio Chest PE W and/or Wo Contrast  Result Date: 09/22/2020 CLINICAL DATA:  Chest pain and shortness of breath. EXAM: CT ANGIOGRAPHY CHEST WITH CONTRAST TECHNIQUE: Multidetector CT imaging of the chest was performed using the standard protocol during bolus administration of intravenous contrast. Multiplanar CT image reconstructions and MIPs were obtained to evaluate the vascular anatomy. CONTRAST:  52mL OMNIPAQUE IOHEXOL 350 MG/ML SOLN COMPARISON:  None. FINDINGS: Cardiovascular: There is moderate severity calcification of the aortic arch and descending thoracic aorta, without evidence of aneurysmal dilatation. A small amount of intraluminal low attenuation is seen within and inferior medial right upper lobe branch of the right pulmonary artery (axial CT images 69 and 70, CT series number 5). Normal heart size. No pericardial effusion. Mediastinum/Nodes: No enlarged mediastinal, hilar, or axillary lymph nodes. Thyroid gland, trachea, and esophagus demonstrate no  significant findings. Lungs/Pleura: Very mild right apical scarring and/or atelectasis is seen. There is no evidence of acute infiltrate, pleural effusion or pneumothorax. Upper Abdomen: A 1.8 cm x 1.1 cm low-attenuation right adrenal mass is seen. The left adrenal gland is nodular in appearance. Musculoskeletal: A chronic deformity is seen involving the body of the sternum. Metallic density fixation screws are seen within the right humeral head. Multilevel degenerative changes are noted throughout the thoracic spine. Chronic loss of vertebral body height is also seen involving multiple mid to lower thoracic spine vertebral bodies. Review of the MIP images confirms the above findings. IMPRESSION: 1. Small amount of pulmonary embolism within a right upper lobe branch of the right pulmonary artery. 2. Low attenuation right adrenal mass which may represent an adrenal adenoma. 3. Postoperative changes involving proximal right humerus. 4. Aortic  atherosclerosis. Aortic Atherosclerosis (ICD10-I70.0). Electronically Signed   By: Aram Candela M.D.   On: 09/22/2020 22:10   DG Chest Port 1 View  Result Date: 09/22/2020 CLINICAL DATA:  Evaluate for pneumonia. EXAM: PORTABLE CHEST 1 VIEW COMPARISON:  09/04/2020 FINDINGS: Lungs are adequately inflated without focal airspace consolidation, effusion or pneumothorax. Cardiomediastinal silhouette and remainder of the exam is unchanged. IMPRESSION: No active disease. Electronically Signed   By: Elberta Fortis M.D.   On: 09/22/2020 15:06   VAS [place] LOWER EXTREMITY VENOUS (DVT) (MC and WL 7a-7p)  Result Date: 09/22/2020  Lower Venous DVT Study Indications: Pain and swelling in LLE. one week s/p fall and fracture.  Limitations: Patient intolerance to compressions/pain at calf. Comparison Study: No prior studies. Performing Technologist: Jean Rosenthal RDMS,RVT  Examination Guidelines: A complete evaluation includes B-mode imaging, spectral Doppler, color Doppler, and power Doppler as needed of all accessible portions of each vessel. Bilateral testing is considered an integral part of a complete examination. Limited examinations for reoccurring indications may be performed as noted. The reflux portion of the exam is performed with the patient in reverse Trendelenburg.  +-----+---------------+---------+-----------+----------+--------------+ RIGHTCompressibilityPhasicitySpontaneityPropertiesThrombus Aging +-----+---------------+---------+-----------+----------+--------------+ CFV  Full           Yes      Yes                                 +-----+---------------+---------+-----------+----------+--------------+   +---------+---------------+---------+-----------+----------+-------------------+ LEFT     CompressibilityPhasicitySpontaneityPropertiesThrombus Aging      +---------+---------------+---------+-----------+----------+-------------------+ CFV      Full           Yes      Yes                                       +---------+---------------+---------+-----------+----------+-------------------+ SFJ      Full                                                             +---------+---------------+---------+-----------+----------+-------------------+ FV Prox  Full                                                             +---------+---------------+---------+-----------+----------+-------------------+  FV Mid   Full                                                             +---------+---------------+---------+-----------+----------+-------------------+ FV DistalFull                                                             +---------+---------------+---------+-----------+----------+-------------------+ PFV      Full                                                             +---------+---------------+---------+-----------+----------+-------------------+ POP      Full           Yes      Yes                                      +---------+---------------+---------+-----------+----------+-------------------+ PTV      Partial                                      Age Indeterminate   +---------+---------------+---------+-----------+----------+-------------------+ PERO                                                  Not well visualized +---------+---------------+---------+-----------+----------+-------------------+     Summary: RIGHT: - No evidence of common femoral vein obstruction.  - Ultrasound characteristics of enlarged lymph nodes are noted in the groin.  LEFT: - Findings consistent with age indeterminate deep vein thrombosis involving a single left posterior tibial vein.  - No cystic structure found in the popliteal fossa.  *See table(s) above for measurements and observations. Electronically signed by Sherald Hess MD on 09/22/2020 at 4:25:48 PM.    Final     EKG: Independently reviewed.  Assessment/Plan Principal Problem:   Pulmonary  embolus (HCC) Active Problems:   Occipital stroke (HCC)   Paroxysmal atrial fibrillation (HCC)   Delirium   Dementia with behavioral disturbance (HCC)    1. PE - 1. Small volume PE, from posterior tibial vein DVT seen on [place] today 2. 2d echo ordered 3. Tele monitor 4. Since EDP already gave first dose, will plan to just continue eliquis for now 5. Keep eye on HGB overnight though as she did have what sounds like melena in Dec 6. Also is high risk of falls 2. H/o Occipital stroke - 1. Hold ASA+Plavix (putting pt on eliquis instead for PE). 2. Acute ischemic stroke not ruled out at this point: will get MRI brain to r/o 3. Delirium on top of dementia - 1. R/o acute ischemic stroke with MRI 2. Treating PE 3. But most likely the mental status  changes may just be due to patient being in new unfamiliar environment (SNF) on top of her chronic dementia. 4. TTS / Psych consult in AM 4. Dementia - 1. Cont home meds 5. HTN - 1. Cont home meds 6. PAF - 1. Cont rate control 2. Not on chronic anticoags due to fall risk 3. But putting on eliquis for the moment due to acute PE and DVT.  DVT prophylaxis: Eliquis Code Status: DNR - per SNF paperwork Family Communication: No family in room Disposition Plan: SNF after admit Consults called: None Admission status: Place in 86obs   Roshad Hack M. DO [facility]  How to contact the [facility] Attending or Consulting provider 7A - 7P or covering provider during after hours 7P -7A, for this patient?  1. Check the care team in [facility] and look for a) attending/consulting TR[facility] provider listed and b) the [facility] team listed 2. Log into [email or web address]  Amion Physician Scheduling and messaging for groups and whole hospitals  On call and physician scheduling software for group practices, residents, hospitalists and other medical providers for call, clinic, rotation and shift schedules. On[employer] is a hospital-wide system for scheduling doctors and  paging doctors on call. EasyPlot is for scientific plotting and data analysis.  [email or web address]  and use [place]'s universal password to access. If you do not have the password, please contact the hospital operator.  3. Locate the [facility] provider you are looking for under [facility] and page to a number that you can be directly reached. 4. If you still have difficulty reaching the provider, please page the [facility] (Director on Call) for the Hospitalists listed on amion for assistance.  09/22/2020, 11:45 PM

## 2020-09-22 NOTE — ED Triage Notes (Signed)
Pt comes from [facility] in [place] via [employer]. EMS reports they were called out for stroke symptoms per pt's daughter's request. No stoke symptoms noted by EMS. EMS reports that pt's daughter stated that pt was hearing voices and speaking to people not present started yesterday morning and then again at 0900 today. Pt hx of dementia, vision deficits (able to make out shadows only). According to staff pt fell a week ago with LLE fx and is at rehab facility following fx. Pt reporting extreme pain to LUE from elbow to fingers, no injury noted on chart or visible to EMS. 18g IV established RFA. BGL-132 BP 138/70 HR 82 O2 98% RA RR 16

## 2020-09-22 NOTE — ED Notes (Signed)
Patient transported to CT 

## 2020-09-22 NOTE — ED Provider Notes (Signed)
[facility] EMERGENCY DEPARTMENT Provider Note   CSN: [id number] Arrival date & time: 09/22/20  1238     History Chief Complaint  Patient presents with  . Altered Mental Status    Tammy Jones is a 87 y.o. female.  Tammy Jones has a history of mild dementia.  Her daughter states that 3 weeks ago, when she was living with her daughter, she had sundowners at night and asked repetitive questions.  However, she was not terribly impaired from her dementia.  She ambulated without assistance.  Then the patient fell and broke her left tibia and fibula.  She had surgery at [facility] and was discharged to a skilled rehab facility.  In the immediate postoperative course, the patient did have a little bit of delirium and reported visual hallucinations.  However, she was doing relatively well at her skilled rehab facility.  Last night the patient's granddaughter spent time with her, and she was normal.  This morning, the patient's daughter went to visit her, and the patient was talking to people in the room that were not there.  The patient's daughter became concerned and called EMS.  The history is provided by the patient and a relative (daughter).  Altered Mental Status Presenting symptoms: disorientation   Presenting symptoms comment:  Visual hallucinations Severity:  Moderate Most recent episode:  Today Episode history:  Multiple Timing:  Intermittent Progression:  Waxing and waning Chronicity:  New Context: dementia   Associated symptoms: abnormal movement (looking up to the ceiling, focused on the right more than left) and hallucinations   Associated symptoms: no abdominal pain, no difficulty breathing, no fever, no palpitations, no rash, no seizures, no slurred speech and no vomiting        Past Medical History:  Diagnosis Date  . Alzheimer's disease (HCC)   . Atrial fibrillation (HCC)   . Cerebral infarction due to thrombosis of right posterior  cerebral artery (HCC)   . GERD (gastroesophageal reflux disease)   . Hyperlipidemia   . Hypertension     Patient Active Problem List   Diagnosis Date Noted  . 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 At high risk for falls 11/28/2019  . 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 MCI (mild cognitive impairment) with memory loss 11/28/2019  . Frailty 05/31/2019  . ACP (advance care planning) 04/30/2019  . Occipital stroke (HCC) 04/05/2019  . Myocardial infarction type 2 (HCC) 05/24/2018  . Presbyopia 01/03/2018  . Regular astigmatism of both eyes 12/17/2017  . Iron deficiency anemia 08/20/2017  . Essential hypertension with goal blood pressure less than 130/80 03/05/2017  . Closed fracture of transverse process of lumbar vertebra with routine healing 02/10/2017  . At risk for bleeding associated with anticoagulants 02/09/2017  . Hypertensive heart disease without CHF 11/12/2016  . Peripheral arterial disease (HCC) 09/18/2016  . Paroxysmal atrial fibrillation (HCC) 09/08/2016  . Coronary artery disease involving native coronary artery of native heart without angina pectoris 08/27/2016  . Allergic conjunctivitis of both eyes 07/24/2016  . LBBB (left bundle branch block) 06/17/2016  . Bilateral dry eyes 05/06/2015  . Impaired fasting glucose 04/11/2015  . Osteopenia 04/03/2015  . Nonexudative age-related macular degeneration 03/22/2015  . Hyperlipidemia 06/12/2014  . Acquired hypothyroidism 04/26/2014  . Osteoarthritis of knee 04/26/2014  . Primary open-angle glaucoma 12/23/2011  . Pseudophakia 12/23/2011    No past surgical history on file.   OB History   No obstetric history on file.     No  family history on file.     Home Medications Prior to Admission medications   Medication Sig Start Date End Date Taking? Authorizing Provider  aspirin 325 MG EC tablet Take by mouth. 06/20/19   [provider]  atorvastatin (LIPITOR) 40 MG tablet Take by mouth.  06/26/19   [provider]  brimonidine (ALPHAGAN) 0.2 % ophthalmic solution Apply to eye. 07/03/19   [provider]  carvedilol (COREG) 3.125 MG tablet Take by mouth. 11/29/19   [provider]  cyclobenzaprine (FLEXERIL) 10 MG tablet Take by mouth. 07/07/19   [provider]  diclofenac Sodium (VOLTAREN) 1 % GEL Apply 2 grams to wrist pain twice daily 07/07/19   [provider]  diltiazem (CARDIZEM CD) 240 MG 24 hr capsule Take 240 mg by mouth daily. 07/25/20   [provider]  diltiazem (TIAZAC) 180 MG 24 hr capsule Take by mouth. 06/26/19   [provider]  dorzolamide-timolol (COSOPT) 22.3-6.8 MG/ML ophthalmic solution Apply to eye. 07/03/19   [provider]  escitalopram (LEXAPRO) 5 MG tablet Take 1 tablet by mouth daily. 02/20/20   [provider]  ferrous sulfate 325 (65 FE) MG tablet Take 1 tablet by mouth daily. 11/30/19   [provider]  gabapentin (NEURONTIN) 100 MG capsule Take 1 capsule by mouth at bedtime. 06/26/19   [provider]  levothyroxine (SYNTHROID) 75 MCG tablet Take by mouth. 07/05/19   [provider]  losartan (COZAAR) 100 MG tablet Take 1 tablet by mouth daily. 03/20/19   [provider]  pantoprazole (PROTONIX) 40 MG tablet Take 1 tablet by mouth daily. 06/26/19   [provider]  sucralfate (CARAFATE) 1 g tablet Take by mouth. 02/20/20   [provider]    Allergies    Apixaban, Hydrocodone, Morphine, Penicillins, and Other  Review of Systems   Review of Systems  Constitutional: Negative for chills and fever.  HENT: Negative for ear pain and sore throat.   Eyes: Negative for pain and visual disturbance.  Respiratory: Negative for cough and shortness of breath.   Cardiovascular: Negative for chest pain and palpitations.  Gastrointestinal: Negative for abdominal pain and vomiting.  Genitourinary: Negative for dysuria and hematuria.   Musculoskeletal: Positive for joint swelling. Negative for arthralgias and back pain.  Skin: Negative for color change and rash.  Neurological: Negative for seizures and syncope.  Psychiatric/Behavioral: Positive for hallucinations.  All other systems reviewed and are negative.   Physical Exam Updated Vital Signs BP (!) 146/69 (BP Location: Right Arm)   Pulse 66   Temp 98.5 F (36.9 C) (Oral)   Resp 16   SpO2 98%   Physical Exam Vitals and nursing note reviewed.  Constitutional:      General: She is not in acute distress.    Appearance: She is well-developed.  HENT:     Head: Normocephalic and atraumatic.  Eyes:     Conjunctiva/sclera: Conjunctivae normal.  Cardiovascular:     Rate and Rhythm: Normal rate and regular rhythm.     Heart sounds: No murmur heard.   Pulmonary:     Effort: Pulmonary effort is normal. No respiratory distress.     Breath sounds: Normal breath sounds.  Abdominal:     Palpations: Abdomen is soft.     Tenderness: There is no abdominal tenderness.  Musculoskeletal:     Cervical back: Neck supple.     Comments: The patient has redness, swelling, and extreme tenderness to palpation over the scaphoid and  the base of the first metacarpal at the left hand.  She has difficulty with wrist range of motion secondary to pain, and finger range of motion is also painful.  The left lower extremity has 2 incision lines that are clean, dry, and intact.  No overt signs or symptoms of infection.  Distal pulses are normal.  Skin:    General: Skin is warm and dry.  Neurological:     Mental Status: She is alert.     Cranial Nerves: Cranial nerves are intact.     Sensory: Sensation is intact.     Motor: Motor function is intact.     Comments: Oriented to person and place. Did not get the date correct  Psychiatric:        Attention and Perception: She perceives visual hallucinations.        Mood and Affect: Mood normal.     Comments: Reported there were people in  the room Mild inattention        [place] was splinted again after the leg was examined.  ED Results / Procedures / Treatments   Labs (all labs ordered are listed, but only abnormal results are displayed) Labs Reviewed  CBC WITH DIFFERENTIAL/PLATELET - Abnormal; Notable for the following components:      Result Value   RBC 3.49 (*)    Hemoglobin 9.3 (*)    HCT 29.0 (*)    RDW 15.9 (*)    All other components within normal limits  D-DIMER, QUANTITATIVE - Abnormal; Notable for the following components:   D-Dimer, Quant 3.81 (*)    All other components within normal limits  COMPREHENSIVE METABOLIC PANEL - Abnormal; Notable for the following components:   Glucose, Bld 125 (*)    Calcium 8.1 (*)    Total Protein 6.1 (*)    Albumin 2.1 (*)    All other components within normal limits  URINALYSIS, ROUTINE W REFLEX MICROSCOPIC - Abnormal; Notable for the following components:   Hgb urine dipstick SMALL (*)    Leukocytes,Ua TRACE (*)    Bacteria, UA RARE (*)    All other components within normal limits  CBG MONITORING, ED - Abnormal; Notable for the following components:   Glucose-Capillary 125 (*)    All other components within normal limits  LACTIC ACID, PLASMA  TROPONIN I (HIGH SENSITIVITY)  TROPONIN I (HIGH SENSITIVITY)    EKG EKG Interpretation  Date/Time:  Sunday September 22 2020 12:45:53 EDT Ventricular Rate:  62 PR Interval:  148 QRS Duration: 141 QT Interval:  474 QTC Calculation: 482 R Axis:   -8 Text Interpretation: Sinus rhythm Left bundle branch block no ischemia by Ermalene Searing criteria no priors Confirmed by Pieter Partridge ([id number]) on 09/22/2020 2:21:21 PM   Radiology DG Wrist Complete Left  Result Date: 09/22/2020 CLINICAL DATA:  Fall 1 week ago. EXAM: LEFT WRIST - COMPLETE 3+ VIEW COMPARISON:  12/19/2019 FINDINGS: Moderate degenerative changes of the radiocarpal joint and distal radioulnar joint as well as the carpal bones and first carpometacarpal joints.  Degenerative change over the first MTP joint and interphalangeal joint. No acute fracture or dislocation. IMPRESSION: No acute findings. Electronically Signed   By: Elberta Fortis M.D.   On: 09/22/2020 15:07   DG Ankle Complete Left  Result Date: 09/22/2020 CLINICAL DATA:  Fall 1 week ago with left ankle pain. EXAM: LEFT ANKLE COMPLETE - 3+ VIEW COMPARISON:  09/04/2020 and 09/06/2020 FINDINGS: Fixation hardware unchanged in intact bridging patient's distal fibular fracture and medial malleolar  fracture. Single screw unchanged bridging the distal tibia/fibular joint. Remainder of the exam is unchanged. IMPRESSION: No acute findings. Electronically Signed   By: Elberta Fortis M.D.   On: 09/22/2020 15:10   CT Head Wo Contrast  Result Date: 09/22/2020 CLINICAL DATA:  Mental status change. EXAM: CT HEAD WITHOUT CONTRAST TECHNIQUE: Contiguous axial images were obtained from the base of the skull through the vertex without intravenous contrast. COMPARISON:  September 12, 2020. FINDINGS: Brain: Evolving encephalomalacia in the right occipital and medial temporal lobe. No definite new acute large vascular territory infarct. No acute hemorrhage. Similar remote left PCA and left PICA territory infarcts and chronic lacunar infarcts in bilateral thalami and right cerebellum. No midline shift. No hydrocephalus. Additional patchy white matter hypoattenuation, nonspecific but most likely related to chronic microvascular ischemic disease. Vascular: Calcific atherosclerosis. No hyperdense vessel identified. Skull: No acute fracture. Sinuses/Orbits: No acute findings. Other: No mastoid effusions. IMPRESSION: 1. Evolving encephalomalacia in the right occipital and medial temporal lobes, corresponding to areas of infarcts seen on recent MRI. No evidence of interval acute intracranial abnormality, although MRI could provide more sensitive evaluation for interval acute infarct if clinically indicated. 2. Similar remote left PCA and left  PICA territory infarcts and chronic lacunar infarcts in bilateral thalami and right cerebellum. Electronically Signed   By: Feliberto Harts MD   On: 09/22/2020 16:55   DG Chest Port 1 View  Result Date: 09/22/2020 CLINICAL DATA:  Evaluate for pneumonia. EXAM: PORTABLE CHEST 1 VIEW COMPARISON:  09/04/2020 FINDINGS: Lungs are adequately inflated without focal airspace consolidation, effusion or pneumothorax. Cardiomediastinal silhouette and remainder of the exam is unchanged. IMPRESSION: No active disease. Electronically Signed   By: Elberta Fortis M.D.   On: 09/22/2020 15:06   VAS [place] LOWER EXTREMITY VENOUS (DVT) ([facility] and [facility] 7a-7p)  Result Date: 09/22/2020  Lower Venous DVT Study Indications: Pain and swelling in LLE. one week s/p fall and fracture.  Limitations: Patient intolerance to compressions/pain at calf. Comparison Study: No prior studies. Performing Technologist: Jean Rosenthal RDMS,RVT  Examination Guidelines: A complete evaluation includes B-mode imaging, spectral Doppler, color Doppler, and power Doppler as needed of all accessible portions of each vessel. Bilateral testing is considered an integral part of a complete examination. Limited examinations for reoccurring indications may be performed as noted. The reflux portion of the exam is performed with the patient in reverse Trendelenburg.  +-----+---------------+---------+-----------+----------+--------------+ RIGHTCompressibilityPhasicitySpontaneityPropertiesThrombus Aging +-----+---------------+---------+-----------+----------+--------------+ CFV  Full           Yes      Yes                                 +-----+---------------+---------+-----------+----------+--------------+   +---------+---------------+---------+-----------+----------+-------------------+ LEFT     CompressibilityPhasicitySpontaneityPropertiesThrombus Aging      +---------+---------------+---------+-----------+----------+-------------------+ CFV      Full            Yes      Yes                                      +---------+---------------+---------+-----------+----------+-------------------+ SFJ      Full                                                             +---------+---------------+---------+-----------+----------+-------------------+  FV Prox  Full                                                             +---------+---------------+---------+-----------+----------+-------------------+ FV Mid   Full                                                             +---------+---------------+---------+-----------+----------+-------------------+ FV DistalFull                                                             +---------+---------------+---------+-----------+----------+-------------------+ PFV      Full                                                             +---------+---------------+---------+-----------+----------+-------------------+ POP      Full           Yes      Yes                                      +---------+---------------+---------+-----------+----------+-------------------+ PTV      Partial                                      Age Indeterminate   +---------+---------------+---------+-----------+----------+-------------------+ PERO                                                  Not well visualized +---------+---------------+---------+-----------+----------+-------------------+     Summary: RIGHT: - No evidence of common femoral vein obstruction.  - Ultrasound characteristics of enlarged lymph nodes are noted in the groin.  LEFT: - Findings consistent with age indeterminate deep vein thrombosis involving a single left posterior tibial vein.  - No cystic structure found in the popliteal fossa.  *See table(s) above for measurements and observations. Electronically signed by Sherald Hess MD on 09/22/2020 at 4:25:48 PM.    Final     Procedures Procedures   Medications  Ordered in ED Medications  apixaban (ELIQUIS) tablet 5 mg (has no administration in time range)  apixaban (ELIQUIS) tablet 10 mg (has no administration in time range)    Followed by  apixaban (ELIQUIS) tablet 5 mg (has no administration in time range)  aspirin EC tablet 81 mg (has no administration in time range)  atorvastatin (LIPITOR) tablet 40 mg (has no administration in time range)  brimonidine (ALPHAGAN) 0.2 % ophthalmic solution 1 drop (has no administration in time range)  Vitamin D3 CAPS 50,000 Units (has no administration in time range)  clopidogrel (PLAVIX) tablet 75 mg (has no administration in time range)  diltiazem (CARDIZEM LA) 24 hr tablet 240 mg (has no administration in time range)  dorzolamide-timolol (COSOPT) 22.3-6.8 MG/ML ophthalmic solution 1 drop (has no administration in time range)  escitalopram (LEXAPRO) tablet 5 mg (has no administration in time range)  gabapentin (NEURONTIN) capsule 100 mg (has no administration in time range)  levothyroxine (SYNTHROID) tablet 75 mcg (has no administration in time range)  losartan (COZAAR) tablet 100 mg (has no administration in time range)  pantoprazole (PROTONIX) EC tablet 40 mg (has no administration in time range)  sucralfate (CARAFATE) 1 GM/10ML suspension 1 g (has no administration in time range)  apixaban (ELIQUIS) tablet 5 mg (has no administration in time range)    ED Course  I have reviewed the triage vital signs and the nursing notes.  Pertinent labs & imaging results that were available during my care of the patient were reviewed by me and considered in my medical decision making (see chart for details).  Clinical Course as of 09/22/20 2019  Sun Sep 22, 2020  2013 I spent quite some time talking to the patient's daughter.  I assured her that we had done a thorough job of evaluating the patient for any acute medical issues that could be contributing to delirium.  I think the patient's delirium is likely secondary  to her recent surgery and also progression of dementia.  The patient is also in a new environment, and this could be contributing to her symptoms.  Nonetheless, the daughter is very worried about the patient's symptoms as she has been hallucinating.  She does not believe the skilled nursing facility has the capacity to fully monitor, evaluate, and treat the symptoms.  The next step in this case, would be to consult behavioral health for recommendations. [AW]    Clinical Course User Index [AW] Koleen Distance, MD   MDM Rules/Calculators/A&P                          Tammy Jones presented with mental status changes.  She was hallucinating, and her daughter was concerned that she was listing to one side.  The patient was fairly well-appearing when I examined her, but she did endorse seeing people in the room.  She was evaluated for any evidence of acute medical condition.  I was concerned about possible infection, and I investigated her urine, lungs, recently broken leg, and left wrist which appeared affected by some pain and swelling.  The patient's urine was equivocal, and I have ordered a culture.  I do not think she has a convincing enough UTI to pin delirium on this diagnosis.  No other obvious source of infection.  The patient was also evaluated for any metabolic condition that could contribute to her mental status change.  No evidence of acute coronary syndrome.  Venous Doppler did reveal a blood clot in her left leg which is likely secondary to her recent injury and relative immobilization.  She was started on Eliquis for this problem.  Ultimately, I think her mental status changes are related to her prior stroke and/or her recent hospitalization and discharged to skilled nursing which is an environment change for her.  However, the patient has been consulted out to TTS for further evaluation with the main question being whether or not she is a candidate for medication management or other  acute treatment.  Final Clinical Impression(s) / ED Diagnoses Final diagnoses:  Delirium  Deep vein thrombosis (DVT) of tibial vein of left lower extremity, unspecified chronicity (HCC)  MCI (mild cognitive impairment) with memory loss  Occipital stroke [facility](HCC)    Rx / DC Orders ED Discharge Orders    None       Koleen DistanceWright, Junah Yam G, MD 09/22/20 2023

## 2020-09-22 NOTE — H&P (Incomplete)
History and Physical    [place] YOV:[id number] DOB: December 11, 1931 DOA: 09/22/2020  PCP: Everlean Cherry, MD  Patient coming from: SNF  I have personally briefly reviewed patient's old medical records in [facility]  Chief Complaint: Hallucinations  HPI: Tammy Jones is a 87 y.o. female with medical history significant of Dementia, prior strokes, PAF not on anticoagulation anymore due to frequent falls, HTN.  3 weeks ago pt living with daughter, would ask repetitive questions and have sundowning at night however not terribly impaired from dementia standpoint.  She ambulated unassisted.  Pt then suffered mechanical fall and broke L TIB-FIB.  She was admitted to [facility] and had ORIF.  Did have some delirium in post-op course and reported visual hallucinations.  Pt discharged to SNF, had reportedly been doing well at SNF.  Was reportedly normal when granddaughter saw her last night.  This morning however, patients daughter went to visit patient and patient was talking to people in room that wernt there.  Daughter called EMS.  Pts mental status has been waxing and waning since then.  Pt denies: abd pain, SOB, fever, palpitations, rash, seizures, slurred speech.   ED Course: W/U in ED shows small pulmonary emboli on CTA in RUL.  CT head shows old strokes that correlate to the (then subacute) stroke seen back in Oct 2020.  HGB 9.3  EDP gave pt dose of eliquis.  TTS consulted, hospitalist asked to admit due to presence of PE.   Review of Systems: As per HPI, otherwise all review of systems negative.  Past Medical History:  Diagnosis Date  . Alzheimer's disease (HCC)   . Atrial fibrillation (HCC)   . 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 GERD (gastroesophageal reflux disease)   . Hyperlipidemia   . Hypertension     Past Surgical History:  Procedure Laterality Date  . ORIF TIBIA & FIBULA FRACTURES Left 2022     reports  that she has quit smoking. She has a 10.00 pack-year smoking history. She does not have any smokeless tobacco history on file. She reports that she does not drink alcohol and does not use drugs.  Allergies  Allergen Reactions  . Apixaban Diarrhea  . Hydrocodone Nausea And Vomiting  . Morphine Other (See Comments)    Hallucinations  . Penicillins Other (See Comments)    "Allergic," per MAR  . Other Rash    LOBSTER    Family History  Problem Relation Age of Onset  . Diabetes Sister   . Stroke Neg Hx      Prior to Admission medications   Medication Sig Start Date End Date Taking? Authorizing Provider  aspirin EC 81 MG tablet Take 81 mg by mouth in the morning. Swallow whole.   Yes [provider]  atorvastatin (LIPITOR) 40 MG tablet Take 40 mg by mouth daily. 06/26/19  Yes [provider]  brimonidine (ALPHAGAN) 0.2 % ophthalmic solution Place 1 drop into both eyes in the morning. 07/03/19  Yes [provider]  Cholecalciferol (VITAMIN D3) 1.25 MG (50000 UT) CAPS Take 50,000 Units by mouth every Tuesday.   Yes [provider]  clopidogrel (PLAVIX) 75 MG tablet Take 75 mg by mouth in the morning.   Yes [provider]  diltiazem (CARDIZEM LA) 240 MG 24 hr tablet Take 240 mg by mouth in the morning.   Yes [provider]  dorzolamide-timolol (COSOPT) 22.3-6.8 MG/ML ophthalmic solution Place 1 drop into both  eyes in the morning. 07/03/19  Yes [provider]  escitalopram (LEXAPRO) 5 MG tablet Take 5 mg by mouth in the morning. 02/20/20  Yes [provider]  gabapentin (NEURONTIN) 100 MG capsule Take 100 mg by mouth in the morning. 06/26/19  Yes [provider]  levothyroxine (SYNTHROID) 75 MCG tablet Take 75 mcg by mouth daily before breakfast. 07/05/19  Yes [provider]  losartan (COZAAR) 100 MG tablet Take 100 mg by mouth in the morning. 03/20/19  Yes [provider]  oxyCODONE (OXY  IR/ROXICODONE) 5 MG immediate release tablet Take 5 mg by mouth every 4 (four) hours as needed for moderate pain or severe pain.  09/22/20 Yes [provider]  pantoprazole (PROTONIX) 40 MG tablet Take 40 mg by mouth in the morning. 06/26/19  Yes [provider]  sucralfate (CARAFATE) 1 GM/10ML suspension Take 1 g by mouth in the morning and at bedtime.   Yes [provider]    Physical Exam: Vitals:   09/22/20 2215 09/22/20 2230 09/22/20 2300 09/22/20 2310  BP: (!) 148/68 (!) 147/66 (!) 163/68   Pulse: 68 63 65 69  Resp: (!) 22 20 18 18   Temp:      TempSrc:      SpO2: 97% 98% 97% 99%    Constitutional: NAD, calm, comfortable Eyes: PERRL, lids and conjunctivae normal ENMT: Mucous membranes are moist. Posterior pharynx clear of any exudate or lesions.Normal dentition.  Neck: normal, supple, no masses, no thyromegaly Respiratory: clear to auscultation bilaterally, no wheezing, no crackles. Normal respiratory effort. No accessory muscle use.  Cardiovascular: Regular rate and rhythm, no murmurs / rubs / gallops. No extremity edema. 2+ pedal pulses. No carotid bruits.  Abdomen: no tenderness, no masses palpated. No hepatosplenomegaly. Bowel sounds positive.  Musculoskeletal: no clubbing / cyanosis. No joint deformity upper and lower extremities. Good ROM, no contractures. Normal muscle tone.  Skin:    Neurologic: CN 2-12 grossly intact. Sensation intact, DTR normal. Strength 5/5 in all 4.  Psychiatric: Normal judgment and insight. Oriented to person and place.   Labs on Admission: I have personally reviewed following labs and imaging studies  CBC: Recent Labs  Lab 09/22/20 1352  WBC 6.8  NEUTROABS 5.0  HGB 9.3*  HCT 29.0*  MCV 83.1  PLT 345   Basic Metabolic Panel: Recent Labs  Lab 09/22/20 1352  NA 135  K 3.6  CL 105  CO2 23  GLUCOSE 125*  BUN 12  CREATININE 0.88  CALCIUM 8.1*   GFR: CrCl cannot be calculated (Unknown ideal  weight.). Liver Function Tests: Recent Labs  Lab 09/22/20 1352  AST 32  ALT 30  ALKPHOS 97  BILITOT 0.7  PROT 6.1*  ALBUMIN 2.1*   No results for input(s): LIPASE, AMYLASE in the last 168 hours. No results for input(s): AMMONIA in the last 168 hours. Coagulation Profile: No results for input(s): INR, PROTIME in the last 168 hours. Cardiac Enzymes: No results for input(s): CKTOTAL, CKMB, CKMBINDEX, TROPONINI in the last 168 hours. BNP (last 3 results) No results for input(s): PROBNP in the last 8760 hours. HbA1C: No results for input(s): HGBA1C in the last 72 hours. CBG: Recent Labs  Lab 09/22/20 1251  GLUCAP 125*   Lipid Profile: No results for input(s): CHOL, HDL, LDLCALC, TRIG, CHOLHDL, LDLDIRECT in the last 72 hours. Thyroid Function Tests: No results for input(s): TSH, T4TOTAL, FREET4, T3FREE, THYROIDAB in the last 72 hours. Anemia Panel: No results for input(s): VITAMINB12, FOLATE, FERRITIN,  TIBC, IRON, RETICCTPCT in the last 72 hours. Urine analysis:    Component Value Date/Time   COLORURINE YELLOW 09/22/2020 1937   APPEARANCEUR CLEAR 09/22/2020 1937   LABSPEC 1.018 09/22/2020 1937   PHURINE 6.0 09/22/2020 1937   GLUCOSEU NEGATIVE 09/22/2020 1937   HGBUR SMALL (A) 09/22/2020 1937   BILIRUBINUR NEGATIVE 09/22/2020 1937   KETONESUR NEGATIVE 09/22/2020 1937   PROTEINUR NEGATIVE 09/22/2020 1937   NITRITE NEGATIVE 09/22/2020 1937   LEUKOCYTESUR TRACE (A) 09/22/2020 1937    Radiological Exams on Admission: DG Wrist Complete Left  Result Date: 09/22/2020 CLINICAL DATA:  Fall 1 week ago. EXAM: LEFT WRIST - COMPLETE 3+ VIEW COMPARISON:  12/19/2019 FINDINGS: Moderate degenerative changes of the radiocarpal joint and distal radioulnar joint as well as the carpal bones and first carpometacarpal joints. Degenerative change over the first MTP joint and interphalangeal joint. No acute fracture or dislocation. IMPRESSION: No acute findings. Electronically Signed   By: Elberta Fortis M.D.   On: 09/22/2020 15:07   DG Ankle Complete Left  Result Date: 09/22/2020 CLINICAL DATA:  Fall 1 week ago with left ankle pain. EXAM: LEFT ANKLE COMPLETE - 3+ VIEW COMPARISON:  09/04/2020 and 09/06/2020 FINDINGS: Fixation hardware unchanged in intact bridging patient's distal fibular fracture and medial malleolar fracture. Single screw unchanged bridging the distal tibia/fibular joint. Remainder of the exam is unchanged. IMPRESSION: No acute findings. Electronically Signed   By: Elberta Fortis M.D.   On: 09/22/2020 15:10   CT Head Wo Contrast  Result Date: 09/22/2020 CLINICAL DATA:  Mental status change. EXAM: CT HEAD WITHOUT CONTRAST TECHNIQUE: Contiguous axial images were obtained from the base of the skull through the vertex without intravenous contrast. COMPARISON:  September 12, 2020. FINDINGS: Brain: Evolving encephalomalacia in the right occipital and medial temporal lobe. No definite new acute large vascular territory infarct. No acute hemorrhage. Similar remote left PCA and left PICA territory infarcts and chronic lacunar infarcts in bilateral thalami and right cerebellum. No midline shift. No hydrocephalus. Additional patchy white matter hypoattenuation, nonspecific but most likely related to chronic microvascular ischemic disease. Vascular: Calcific atherosclerosis. No hyperdense vessel identified. Skull: No acute fracture. Sinuses/Orbits: No acute findings. Other: No mastoid effusions. IMPRESSION: 1. Evolving encephalomalacia in the right occipital and medial temporal lobes, corresponding to areas of infarcts seen on recent MRI. No evidence of interval acute intracranial abnormality, although MRI could provide more sensitive evaluation for interval acute infarct if clinically indicated. 2. Similar remote left PCA and left PICA territory infarcts and chronic lacunar infarcts in bilateral thalami and right cerebellum. Electronically Signed   By: Feliberto Harts MD   On: 09/22/2020 16:55    CT Angio Chest PE W and/or Wo Contrast  Result Date: 09/22/2020 CLINICAL DATA:  Chest pain and shortness of breath. EXAM: CT ANGIOGRAPHY CHEST WITH CONTRAST TECHNIQUE: Multidetector CT imaging of the chest was performed using the standard protocol during bolus administration of intravenous contrast. Multiplanar CT image reconstructions and MIPs were obtained to evaluate the vascular anatomy. CONTRAST:  52mL OMNIPAQUE IOHEXOL 350 MG/ML SOLN COMPARISON:  None. FINDINGS: Cardiovascular: There is moderate severity calcification of the aortic arch and descending thoracic aorta, without evidence of aneurysmal dilatation. A small amount of intraluminal low attenuation is seen within and inferior medial right upper lobe branch of the right pulmonary artery (axial CT images 69 and 70, CT series number 5). Normal heart size. No pericardial effusion. Mediastinum/Nodes: No enlarged mediastinal, hilar, or axillary lymph nodes. Thyroid gland, trachea, and esophagus demonstrate no  significant findings. Lungs/Pleura: Very mild right apical scarring and/or atelectasis is seen. There is no evidence of acute infiltrate, pleural effusion or pneumothorax. Upper Abdomen: A 1.8 cm x 1.1 cm low-attenuation right adrenal mass is seen. The left adrenal gland is nodular in appearance. Musculoskeletal: A chronic deformity is seen involving the body of the sternum. Metallic density fixation screws are seen within the right humeral head. Multilevel degenerative changes are noted throughout the thoracic spine. Chronic loss of vertebral body height is also seen involving multiple mid to lower thoracic spine vertebral bodies. Review of the MIP images confirms the above findings. IMPRESSION: 1. Small amount of pulmonary embolism within a right upper lobe branch of the right pulmonary artery. 2. Low attenuation right adrenal mass which may represent an adrenal adenoma. 3. Postoperative changes involving proximal right humerus. 4. Aortic  atherosclerosis. Aortic Atherosclerosis (ICD10-I70.0). Electronically Signed   By: Aram Candela M.D.   On: 09/22/2020 22:10   DG Chest Port 1 View  Result Date: 09/22/2020 CLINICAL DATA:  Evaluate for pneumonia. EXAM: PORTABLE CHEST 1 VIEW COMPARISON:  09/04/2020 FINDINGS: Lungs are adequately inflated without focal airspace consolidation, effusion or pneumothorax. Cardiomediastinal silhouette and remainder of the exam is unchanged. IMPRESSION: No active disease. Electronically Signed   By: Elberta Fortis M.D.   On: 09/22/2020 15:06   VAS [place] LOWER EXTREMITY VENOUS (DVT) (MC and WL 7a-7p)  Result Date: 09/22/2020  Lower Venous DVT Study Indications: Pain and swelling in LLE. one week s/p fall and fracture.  Limitations: Patient intolerance to compressions/pain at calf. Comparison Study: No prior studies. Performing Technologist: Jean Rosenthal RDMS,RVT  Examination Guidelines: A complete evaluation includes B-mode imaging, spectral Doppler, color Doppler, and power Doppler as needed of all accessible portions of each vessel. Bilateral testing is considered an integral part of a complete examination. Limited examinations for reoccurring indications may be performed as noted. The reflux portion of the exam is performed with the patient in reverse Trendelenburg.  +-----+---------------+---------+-----------+----------+--------------+ RIGHTCompressibilityPhasicitySpontaneityPropertiesThrombus Aging +-----+---------------+---------+-----------+----------+--------------+ CFV  Full           Yes      Yes                                 +-----+---------------+---------+-----------+----------+--------------+   +---------+---------------+---------+-----------+----------+-------------------+ LEFT     CompressibilityPhasicitySpontaneityPropertiesThrombus Aging      +---------+---------------+---------+-----------+----------+-------------------+ CFV      Full           Yes      Yes                                       +---------+---------------+---------+-----------+----------+-------------------+ SFJ      Full                                                             +---------+---------------+---------+-----------+----------+-------------------+ FV Prox  Full                                                             +---------+---------------+---------+-----------+----------+-------------------+  FV Mid   Full                                                             +---------+---------------+---------+-----------+----------+-------------------+ FV DistalFull                                                             +---------+---------------+---------+-----------+----------+-------------------+ PFV      Full                                                             +---------+---------------+---------+-----------+----------+-------------------+ POP      Full           Yes      Yes                                      +---------+---------------+---------+-----------+----------+-------------------+ PTV      Partial                                      Age Indeterminate   +---------+---------------+---------+-----------+----------+-------------------+ PERO                                                  Not well visualized +---------+---------------+---------+-----------+----------+-------------------+     Summary: RIGHT: - No evidence of common femoral vein obstruction.  - Ultrasound characteristics of enlarged lymph nodes are noted in the groin.  LEFT: - Findings consistent with age indeterminate deep vein thrombosis involving a single left posterior tibial vein.  - No cystic structure found in the popliteal fossa.  *See table(s) above for measurements and observations. Electronically signed by Sherald Hess MD on 09/22/2020 at 4:25:48 PM.    Final     EKG: Independently reviewed.  Assessment/Plan Principal Problem:   Pulmonary  embolus (HCC) Active Problems:   Occipital stroke (HCC)   Paroxysmal atrial fibrillation (HCC)   Delirium   Dementia with behavioral disturbance (HCC)    1. PE - 1. Small volume PE, from posterior tibial vein DVT seen on [place] today 2. 2d echo ordered 3. Tele monitor 4. Since EDP already gave first dose, Rajah Tagliaferro plan to just continue eliquis for now 5. Keep eye on HGB overnight though as she did have what sounds like melena in Dec 6. Also is high risk of falls 2. H/o Occipital stroke - 1. Hold ASA+Plavix (putting pt on eliquis instead for PE). 2. Acute ischemic stroke not ruled out at this point: Kolbey Teichert get MRI brain to r/o 3. Delirium on top of dementia - 1. R/o acute ischemic stroke with MRI 2. Treating PE 3. But most likely the mental status  changes may just be due to patient being in new unfamiliar environment (SNF) on top of her chronic dementia. 4. TTS / Psych consult in AM 4. Dementia - 1. Cont home meds 5. HTN - 1. Cont home meds  DVT prophylaxis: *** Code Status: *** Family Communication: *** Disposition Plan: *** Consults called: *** Admission status: ***  Severity of Illness: {Observation/Inpatient:21159}   GARDNER, JARED M. DO [facility]  How to contact the [facility] Attending or Consulting provider 7A - 7P or covering provider during after hours 7P -7A, for this patient?  1. Check the care team in [facility] and look for a) attending/consulting [facility] provider listed and b) the [facility] team listed 2. Log into [email or web address]  [employer] Physician Scheduling and messaging for groups and whole hospitals  On call and physician scheduling software for group practices, residents, hospitalists and other medical providers for call, clinic, rotation and shift schedules. On[employer] is a hospital-wide system for scheduling doctors and paging doctors on call. EasyPlot is for scientific plotting and data analysis.  [email or web address]  and use [facility] universal password to access. If you  do not have the password, please contact the hospital operator.  3. Locate the [facility] provider you are looking for under [facility] and page to a number that you can be directly reached. 4. If you still have difficulty reaching the provider, please page the [facility] (Director on Call) for the Hospitalists listed on amion for assistance.  09/22/2020, 11:45 PM

## 2020-09-22 NOTE — Progress Notes (Signed)
Orthopedic Tech Progress Note Patient Details:  Aleese Kamps [birth year] [id number]  Ortho Devices Type of Ortho Device: Post (short leg) splint Splint Material: Fiberglass Ortho Device/Splint Location: Left Lower Extremity Ortho Device/Splint Interventions: Ordered,Application   Post Interventions Patient Tolerated: Fair Instructions Provided: Care of device,Poper ambulation with device   Gerald Stabs 09/22/2020, 5:16 PM

## 2020-09-23 ENCOUNTER — Observation Stay (HOSPITAL_BASED_OUTPATIENT_CLINIC_OR_DEPARTMENT_OTHER): Payer: Medicare Other

## 2020-09-23 ENCOUNTER — Observation Stay (HOSPITAL_COMMUNITY): Payer: Medicare Other

## 2020-09-23 DIAGNOSIS — I2699 Other pulmonary embolism without acute cor pulmonale: Secondary | ICD-10-CM | POA: Diagnosis not present

## 2020-09-23 DIAGNOSIS — I480 Paroxysmal atrial fibrillation: Secondary | ICD-10-CM

## 2020-09-23 DIAGNOSIS — I2693 Single subsegmental pulmonary embolism without acute cor pulmonale: Secondary | ICD-10-CM

## 2020-09-23 LAB — CBC
HCT: 27.9 % — ABNORMAL LOW (ref 36.0–46.0)
Hemoglobin: 8.9 g/dL — ABNORMAL LOW (ref 12.0–15.0)
MCH: 26.6 pg (ref 26.0–34.0)
MCHC: 31.9 g/dL (ref 30.0–36.0)
MCV: 83.5 fL (ref 80.0–100.0)
Platelets: 321 10*3/uL (ref 150–400)
RBC: 3.34 MIL/uL — ABNORMAL LOW (ref 3.87–5.11)
RDW: 15.9 % — ABNORMAL HIGH (ref 11.5–15.5)
WBC: 6.2 10*3/uL (ref 4.0–10.5)
nRBC: 0 % (ref 0.0–0.2)

## 2020-09-23 LAB — ECHOCARDIOGRAM COMPLETE
AR max vel: 2.71 cm2
AV Area VTI: 2.48 cm2
AV Area mean vel: 2.48 cm2
AV Mean grad: 4 mmHg
AV Peak grad: 6.9 mmHg
Ao pk vel: 1.32 m/s
Area-P 1/2: 2.48 cm2
Calc EF: 47.9 %
MV VTI: 1.66 cm2
P 1/2 time: 585 msec
S' Lateral: 2.4 cm
Single Plane A2C EF: 43.1 %
Single Plane A4C EF: 53.1 %

## 2020-09-23 LAB — BASIC METABOLIC PANEL
Anion gap: 7 (ref 5–15)
BUN: 11 mg/dL (ref 8–23)
CO2: 23 mmol/L (ref 22–32)
Calcium: 8.3 mg/dL — ABNORMAL LOW (ref 8.9–10.3)
Chloride: 105 mmol/L (ref 98–111)
Creatinine, Ser: 0.88 mg/dL (ref 0.44–1.00)
GFR, Estimated: >60 mL/min (ref >60)
Glucose, Bld: 110 mg/dL — ABNORMAL HIGH (ref 70–99)
Potassium: 3.5 mmol/L (ref 3.5–5.1)
Sodium: 135 mmol/L (ref 135–145)

## 2020-09-23 LAB — SARS CORONAVIRUS 2 (TAT 6-24 HRS): SARS Coronavirus 2: NEGATIVE

## 2020-09-23 NOTE — ED Notes (Signed)
Pt returned from MRI and placed back on the monitor

## 2020-09-23 NOTE — Plan of Care (Signed)

## 2020-09-23 NOTE — Progress Notes (Signed)
PROGRESS NOTE    Tammy Jones  HYW:[id number] DOB: 1931-10-10 DOA: 09/22/2020 PCP: Everlean Cherry, MD   Brief Narrative:  This 87 yrs old female with PMH significant for dementia, prior stroke, PAF not on anticoagulation anymore due to recent falls, hypertension.  Patient was living with her daughter three weeks ago, had sundowning at night and was asking repetitive questions, suffered mechanical fall and broke left tibia and fibula.  She was admitted in the [facility] and had open reduction internal fixation.  Hospital course was complicated by postoperative visual hallucinations and delirium.  Patient was subsequently stabilized and discharged to skilled nursing facility for rehab,  she was doing well until yesterday when patient's daughter went to see her mom,  she was very confused and has fluctuations in her mental status.  She has brought in the ED,  work-up found out that she has small pulmonary emboli on CTA chest.CT head: showed old strokes.  Patient was admitted for delirium and pulmonary embolism was started on Eliquis.  MRI shows evolving stroke.  Neurology was consulted, Patient had MRI last month, showing the same,  not a new stroke.  Patient is having delirium.   Assessment & Plan:   Principal Problem:   Pulmonary embolus (HCC) Active Problems:   Occipital stroke (HCC)   Paroxysmal atrial fibrillation (HCC)   Delirium   Dementia with behavioral disturbance (HCC)   Delirium on pre-existing dementia: Patient was found to have fluctuations in mental status at nursing home. Work-up in the ED,  found to have a small PE. CT head shows evolving stroke,  MRI showed the same. Neurology consulted Patient had MRI last month showing the same findings, there is no new stroke,  Patient is having delirium. Neurochecks every 6 hours. Mental status changes may be due to patient being in new unfamiliar environment and pre-existing dementia. Psych consulted, awaiting  recommendation. Patient does have history of occipital stroke. Patient is started on Eliquis instead for PE, aspirin and Plavix kept on hold.  Pulmonary embolism: Patient is found to have small volume PE, from posterior tibial vein DVT seen on ultrasound. Obtain 2D echocardiogram,  continue telemetry. We will continue Eliquis. Monitor H&H.  Daughter reports history of black stools in the past. Patient is also at high risk for falls.  Hypertension : resume home medications   Paroxysmal A. fib.   Heart rate is controlled She was not on any anticoagulation given risk of falls. But now patient started on Eliquis given PE and DVT.  Left ankle fracture: continue supportive care. Adequate pain control.  DVT prophylaxis: Eliquis Code Status: DNR Family Communication: No family at bedside Disposition Plan:   Status is: Observation  The patient remains OBS appropriate and will d/c before 2 midnights.  Dispo: The patient is from: SNF              Anticipated d/c is to: SNF              Patient currently is not medically stable to d/c.   Difficult to place patient No   Consultants:   Neurology  Procedures: Echocardiogram Antimicrobials:   Anti-infectives (From admission, onward)   None      Subjective: Patient was seen and examined at bedside.  Overnight events noted.  Patient is alert, awake,  states she has not slept last night , wants to sleep.  Patient denies any suicidal homicidal ideations.  Daughter at bedside,  all questions answered.  Objective: Vitals:   09/23/20  0600 09/23/20 0641 09/23/20 1000 09/23/20 1100  BP:  (!) 164/84 113/60 114/64  Pulse: 65 80 64 (!) 58  Resp: (!) Temp: 98.5 F (36.9 C)     TempSrc: Oral     SpO2: 97% 99% 96% 97%   No intake or output data in the 24 hours ending 09/23/20 1346 There were no vitals filed for this visit.  Examination:  General exam: Appears calm and comfortable, not in any acute  distress. Respiratory system: Clear to auscultation. Respiratory effort normal. Cardiovascular system: S1 & S2 heard, RRR. No JVD, murmurs, rubs, gallops or clicks. No pedal edema. Gastrointestinal system: Abdomen is nondistended, soft and nontender. No organomegaly or masses felt.  Normal bowel sounds heard. Central nervous system: Alert and oriented. No focal neurological deficits. Extremities: Symmetric 5 x 5 power. Skin: No rashes, lesions or ulcers Psychiatry: Judgement and insight appear normal. Mood & affect appropriate.     Data Reviewed: I have personally reviewed following labs and imaging studies  CBC: Recent Labs  Lab 09/22/20 1352 09/23/20 0410  WBC 6.8 6.2  NEUTROABS 5.0  --   HGB 9.3* 8.9*  HCT 29.0* 27.9*  MCV 83.1 83.5  PLT 345 321   Basic Metabolic Panel: Recent Labs  Lab 09/22/20 1352 09/23/20 0410  NA 135 135  K 3.6 3.5  CL 105 105  CO2 23 23  GLUCOSE 125* 110*  BUN 12 11  CREATININE 0.88 0.88  CALCIUM 8.1* 8.3*   GFR: CrCl cannot be calculated (Unknown ideal weight.). Liver Function Tests: Recent Labs  Lab 09/22/20 1352  AST 32  ALT 30  ALKPHOS 97  BILITOT 0.7  PROT 6.1*  ALBUMIN 2.1*   No results for input(s): LIPASE, AMYLASE in the last 168 hours. No results for input(s): AMMONIA in the last 168 hours. Coagulation Profile: No results for input(s): INR, PROTIME in the last 168 hours. Cardiac Enzymes: No results for input(s): CKTOTAL, CKMB, CKMBINDEX, TROPONINI in the last 168 hours. BNP (last 3 results) No results for input(s): PROBNP in the last 8760 hours. HbA1C: No results for input(s): HGBA1C in the last 72 hours. CBG: Recent Labs  Lab 09/22/20 1251  GLUCAP 125*   Lipid Profile: No results for input(s): CHOL, HDL, LDLCALC, TRIG, CHOLHDL, LDLDIRECT in the last 72 hours. Thyroid Function Tests: No results for input(s): TSH, T4TOTAL, FREET4, T3FREE, THYROIDAB in the last 72 hours. Anemia Panel: No results for input(s):  VITAMINB12, FOLATE, FERRITIN, TIBC, IRON, RETICCTPCT in the last 72 hours. Sepsis Labs: Recent Labs  Lab 09/22/20 1353  LATICACIDVEN 0.9    Recent Results (from the past 240 hour(s))  SARS CORONAVIRUS 2 (TAT 6-24 HRS) Nasopharyngeal Nasopharyngeal Swab     Status: None   Collection Time: 09/23/20 12:18 AM   Specimen: Nasopharyngeal Swab  Result Value Ref Range Status   SARS Coronavirus 2 NEGATIVE NEGATIVE Final    Comment: (NOTE) SARS-CoV-2 target nucleic acids are NOT DETECTED.  The SARS-CoV-2 RNA is generally detectable in upper and lower respiratory specimens during the acute phase of infection. Negative results do not preclude SARS-CoV-2 infection, do not rule out co-infections with other pathogens, and should not be used as the sole basis for treatment or other patient management decisions. Negative results must be combined with clinical observations, patient history, and epidemiological information. The expected result is Negative.  Fact Sheet for Patients: HairSlick.no  Fact Sheet for Healthcare Providers: quierodirigir.com  This test is not yet approved or cleared by  the [employer] and  has been authorized for detection and/or diagnosis of SARS-CoV-2 by FDA under an Emergency Use Authorization (EUA). This EUA will remain  in effect (meaning this test can be used) for the duration of the COVID-19 declaration under Se ction 564(b)(1) of the Act, 21 U.S.C. section 360bbb-3(b)(1), unless the authorization is terminated or revoked sooner.  Performed at [facility], [address], [place], [place] [address]     Radiology Studies: DG Wrist Complete Left  Result Date: 09/22/2020 CLINICAL DATA:  Fall 1 week ago. EXAM: LEFT WRIST - COMPLETE 3+ VIEW COMPARISON:  12/19/2019 FINDINGS: Moderate degenerative changes of the radiocarpal joint and distal radioulnar joint as well as the carpal bones and first  carpometacarpal joints. Degenerative change over the first MTP joint and interphalangeal joint. No acute fracture or dislocation. IMPRESSION: No acute findings. Electronically Signed   By: Elberta Fortis M.D.   On: 09/22/2020 15:07   DG Ankle Complete Left  Result Date: 09/22/2020 CLINICAL DATA:  Fall 1 week ago with left ankle pain. EXAM: LEFT ANKLE COMPLETE - 3+ VIEW COMPARISON:  09/04/2020 and 09/06/2020 FINDINGS: Fixation hardware unchanged in intact bridging patient's distal fibular fracture and medial malleolar fracture. Single screw unchanged bridging the distal tibia/fibular joint. Remainder of the exam is unchanged. IMPRESSION: No acute findings. Electronically Signed   By: Elberta Fortis M.D.   On: 09/22/2020 15:10   DG Abdomen 1 View  Result Date: 09/23/2020 CLINICAL DATA:  Altered mental status. EXAM: ABDOMEN - 1 VIEW COMPARISON:  None. FINDINGS: The bowel gas pattern is normal. No radio-opaque calculi or other significant radiographic abnormality are seen. IMPRESSION: Negative. Electronically Signed   By: Katherine Mantle M.D.   On: 09/23/2020 01:42   CT Head Wo Contrast  Result Date: 09/22/2020 CLINICAL DATA:  Mental status change. EXAM: CT HEAD WITHOUT CONTRAST TECHNIQUE: Contiguous axial images were obtained from the base of the skull through the vertex without intravenous contrast. COMPARISON:  September 12, 2020. FINDINGS: Brain: Evolving encephalomalacia in the right occipital and medial temporal lobe. No definite new acute large vascular territory infarct. No acute hemorrhage. Similar remote left PCA and left PICA territory infarcts and chronic lacunar infarcts in bilateral thalami and right cerebellum. No midline shift. No hydrocephalus. Additional patchy white matter hypoattenuation, nonspecific but most likely related to chronic microvascular ischemic disease. Vascular: Calcific atherosclerosis. No hyperdense vessel identified. Skull: No acute fracture. Sinuses/Orbits: No acute findings.  Other: No mastoid effusions. IMPRESSION: 1. Evolving encephalomalacia in the right occipital and medial temporal lobes, corresponding to areas of infarcts seen on recent MRI. No evidence of interval acute intracranial abnormality, although MRI could provide more sensitive evaluation for interval acute infarct if clinically indicated. 2. Similar remote left PCA and left PICA territory infarcts and chronic lacunar infarcts in bilateral thalami and right cerebellum. Electronically Signed   By: Feliberto Harts MD   On: 09/22/2020 16:55   CT Angio Chest PE W and/or Wo Contrast  Result Date: 09/22/2020 CLINICAL DATA:  Chest pain and shortness of breath. EXAM: CT ANGIOGRAPHY CHEST WITH CONTRAST TECHNIQUE: Multidetector CT imaging of the chest was performed using the standard protocol during bolus administration of intravenous contrast. Multiplanar CT image reconstructions and MIPs were obtained to evaluate the vascular anatomy. CONTRAST:  75mL OMNIPAQUE IOHEXOL 350 MG/ML SOLN COMPARISON:  None. FINDINGS: Cardiovascular: There is moderate severity calcification of the aortic arch and descending thoracic aorta, without evidence of aneurysmal dilatation. A small amount of intraluminal low  attenuation is seen within and inferior medial right upper lobe branch of the right pulmonary artery (axial CT images 69 and 70, CT series number 5). Normal heart size. No pericardial effusion. Mediastinum/Nodes: No enlarged mediastinal, hilar, or axillary lymph nodes. Thyroid gland, trachea, and esophagus demonstrate no significant findings. Lungs/Pleura: Very mild right apical scarring and/or atelectasis is seen. There is no evidence of acute infiltrate, pleural effusion or pneumothorax. Upper Abdomen: A 1.8 cm x 1.1 cm low-attenuation right adrenal mass is seen. The left adrenal gland is nodular in appearance. Musculoskeletal: A chronic deformity is seen involving the body of the sternum. Metallic density fixation screws are seen  within the right humeral head. Multilevel degenerative changes are noted throughout the thoracic spine. Chronic loss of vertebral body height is also seen involving multiple mid to lower thoracic spine vertebral bodies. Review of the MIP images confirms the above findings. IMPRESSION: 1. Small amount of pulmonary embolism within a right upper lobe branch of the right pulmonary artery. 2. Low attenuation right adrenal mass which may represent an adrenal adenoma. 3. Postoperative changes involving proximal right humerus. 4. Aortic atherosclerosis. Aortic Atherosclerosis (ICD10-I70.0). Electronically Signed   By: Aram Candela M.D.   On: 09/22/2020 22:10   MR BRAIN WO CONTRAST  Result Date: 09/23/2020 CLINICAL DATA:  87 year old female with altered mental status. 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 EXAM: MRI HEAD WITHOUT CONTRAST TECHNIQUE: Multiplanar, multiecho pulse sequences of the brain and surrounding structures were obtained without intravenous contrast. COMPARISON:  Head CT 09/22/2020. [facility] brain MRI 09/12/2020. FINDINGS: Study is intermittently degraded by motion artifact despite repeated imaging attempts. Brain: Resolved right PCA territory restricted diffusion since the prior MRI. Developing encephalomalacia in the medial right occipital lobe now. Contralateral chronic left PCA territory encephalomalacia. Punctate residual abnormal diffusion in the posterior right corona radiata which was present previously. No new areas of abnormal diffusion. No midline shift, mass effect, evidence of mass lesion, ventriculomegaly, extra-axial collection or acute intracranial hemorrhage. Cervicomedullary junction within normal limits. Punctate T1 hyperintense area along the pituitary infundibulum appears stable and benign. Patchy and scattered bilateral cerebral white matter T2 and FLAIR hyperintensity is stable. Moderate similar T2 heterogeneity in the pons. Chronic left thalamic and bilateral  cerebellar infarcts are stable. No significant chronic blood products. No new signal abnormality. Vascular: Major intracranial vascular flow voids appear stable from last month. Skull and upper cervical spine: Stable and negative. Sinuses/Orbits: Stable and negative. Other: Mastoids remain clear. IMPRESSION: 1. No acute intracranial abnormality. 2. Expected evolution of the Right PCA territory infarct seen last month. And fading small white matter lacunar infarct in the posterior right corona radiata. 3. Underlying chronic white matter disease, chronic left PCA and bilateral cerebellar territory infarcts. Electronically Signed   By: Odessa Fleming M.D.   On: 09/23/2020 04:30   DG Chest Port 1 View  Result Date: 09/22/2020 CLINICAL DATA:  Evaluate for pneumonia. EXAM: PORTABLE CHEST 1 VIEW COMPARISON:  09/04/2020 FINDINGS: Lungs are adequately inflated without focal airspace consolidation, effusion or pneumothorax. Cardiomediastinal silhouette and remainder of the exam is unchanged. IMPRESSION: No active disease. Electronically Signed   By: Elberta Fortis M.D.   On: 09/22/2020 15:06   ECHOCARDIOGRAM COMPLETE  Result Date: 09/23/2020    ECHOCARDIOGRAM REPORT   Patient Name:   Tammy Jones Date of Exam: 09/23/2020 Medical Rec #:  [id number]        Height:       64.0 in Accession #:    [id number]  Weight:       165.0 lb Date of Birth:  [birth year]        BSA:          1.802 m Patient Age:    89 years         BP:           164/84 mmHg Patient Gender: F                HR:           64 bpm. Exam Location:  Inpatient Procedure: 2D Echo, Cardiac Doppler and Color Doppler Indications:    Pulmonary embolus  History:        Patient has no prior history of Echocardiogram examinations.                 Stroke, Arrythmias:Atrial Fibrillation; Risk                 Factors:Hypertension and Dyslipidemia.  Sonographer:    Neomia Dear RDCS Referring Phys: 12 JARED M GARDNER IMPRESSIONS  1. Abnormal septal motion ? from LBBB. Left  ventricular ejection fraction, by estimation, is 50 to 55%. The left ventricle has low normal function. The left ventricle has no regional wall motion abnormalities. There is mild left ventricular hypertrophy.  Left ventricular diastolic parameters are indeterminate.  2. Right ventricular systolic function is normal. The right ventricular size is normal. There is normal pulmonary artery systolic pressure.  3. Left atrial size was moderately dilated.  4. The mitral valve is degenerative. No evidence of mitral valve regurgitation. No evidence of mitral stenosis. Severe mitral annular calcification.  5. The aortic valve was not well visualized. There is moderate calcification of the aortic valve. Aortic valve regurgitation is trivial. Mild to moderate aortic valve sclerosis/calcification is present, without any evidence of aortic stenosis.  6. The inferior vena cava is normal in size with greater than 50% respiratory variability, suggesting right atrial pressure of 3 mmHg. FINDINGS  Left Ventricle: Abnormal septal motion ? from LBBB. Left ventricular ejection fraction, by estimation, is 50 to 55%. The left ventricle has low normal function. The left ventricle has no regional wall motion abnormalities. The left ventricular internal cavity size was normal in size. There is mild left ventricular hypertrophy. Left ventricular diastolic parameters are indeterminate. Right Ventricle: The right ventricular size is normal. No increase in right ventricular wall thickness. Right ventricular systolic function is normal. There is normal pulmonary artery systolic pressure. The tricuspid regurgitant velocity is 2.01 m/s, and  with an assumed right atrial pressure of 3 mmHg, the estimated right ventricular systolic pressure is 19.2 mmHg. Left Atrium: Left atrial size was moderately dilated. Right Atrium: Right atrial size was normal in size. Pericardium: There is no evidence of pericardial effusion. Mitral Valve: The mitral valve is  degenerative in appearance. Severe mitral annular calcification. No evidence of mitral valve regurgitation. No evidence of mitral valve stenosis. MV peak gradient, 6.0 mmHg. The mean mitral valve gradient is 2.0 mmHg. Tricuspid Valve: The tricuspid valve is normal in structure. Tricuspid valve regurgitation is not demonstrated. No evidence of tricuspid stenosis. Aortic Valve: The aortic valve was not well visualized. There is moderate calcification of the aortic valve. Aortic valve regurgitation is trivial. Aortic regurgitation PHT measures 585 msec. Mild to moderate aortic valve sclerosis/calcification is present, without any evidence of aortic stenosis. Aortic valve mean gradient measures 4.0 mmHg. Aortic valve peak gradient measures 6.9 mmHg. Aortic valve area, by VTI measures 2.48 cm.  Pulmonic Valve: The pulmonic valve was normal in structure. Pulmonic valve regurgitation is not visualized. No evidence of pulmonic stenosis. Aorta: The aortic root is normal in size and structure. Venous: The inferior vena cava is normal in size with greater than 50% respiratory variability, suggesting right atrial pressure of 3 mmHg. IAS/Shunts: No atrial level shunt detected by color flow Doppler.  LEFT VENTRICLE PLAX 2D LVIDd:         4.20 cm     Diastology LVIDs:         2.40 cm     LV e' medial:    4.29 cm/s LV PW:         1.50 cm     LV E/e' medial:  22.1 LV IVS:        1.20 cm     LV e' lateral:   5.17 cm/s LVOT diam:     2.00 cm     LV E/e' lateral: 18.3 LV SV:         65 LV SV Index:   36 LVOT Area:     3.14 cm  LV Volumes (MOD) LV vol d, MOD A2C: 21.6 ml LV vol d, MOD A4C: 79.9 ml LV vol s, MOD A2C: 12.3 ml LV vol s, MOD A4C: 37.5 ml LV SV MOD A2C:     9.3 ml LV SV MOD A4C:     79.9 ml LV SV MOD BP:      20.7 ml RIGHT VENTRICLE TAPSE (M-mode): 1.3 cm  PULMONARY VEINS                         A Reversal Duration: 120.00 msec                         A Reversal Velocity: 20.70 cm/s                         Diastolic  Velocity:  22.60 cm/s                         S/D Velocity:        1.70                         Systolic Velocity:   39.10 cm/s LEFT ATRIUM             Index       RIGHT ATRIUM           Index LA diam:        4.50 cm 2.50 cm/m  RA Area:     12.70 cm LA Vol (A2C):   53.2 ml 29.52 ml/m RA Volume:   26.30 ml  14.59 ml/m LA Vol (A4C):   58.2 ml 32.29 ml/m LA Biplane Vol: 56.6 ml 31.40 ml/m  AORTIC VALVE                   PULMONIC VALVE AV Area (Vmax):    2.71 cm    PV Vmax:       0.61 m/s AV Area (Vmean):   2.48 cm    PV Vmean:      51.900 cm/s AV Area (VTI):     2.48 cm    PV VTI:        0.160 m AV Vmax:  131.50 cm/s PV Peak grad:  1.5 mmHg AV Vmean:          89.500 cm/s PV Mean grad:  1.0 mmHg AV VTI:            0.262 m AV Peak Grad:      6.9 mmHg AV Mean Grad:      4.0 mmHg LVOT Vmax:         113.50 cm/s LVOT Vmean:        70.550 cm/s LVOT VTI:          0.207 m LVOT/AV VTI ratio: 0.79 AI PHT:            585 msec  AORTA Ao Root diam: 3.20 cm Ao Asc diam:  2.60 cm MITRAL VALVE                TRICUSPID VALVE MV Area (PHT): 2.48 cm     TR Peak grad:   16.2 mmHg MV Area VTI:   1.66 cm     TR Vmax:        201.00 cm/s MV Peak grad:  6.0 mmHg MV Mean grad:  2.0 mmHg     SHUNTS MV Vmax:       1.22 m/s     Systemic VTI:  0.21 m MV Vmean:      71.6 cm/s    Systemic Diam: 2.00 cm MV Decel Time: 306 msec MV E velocity: 94.70 cm/s MV A velocity: 125.00 cm/s MV E/A ratio:  0.76 Charlton Haws MD Electronically signed by Charlton Haws MD Signature Date/Time: 09/23/2020/10:45:37 AM    Final    VAS [place] LOWER EXTREMITY VENOUS (DVT) (MC and WL 7a-7p)  Result Date: 09/22/2020  Lower Venous DVT Study Indications: Pain and swelling in LLE. one week s/p fall and fracture.  Limitations: Patient intolerance to compressions/pain at calf. Comparison Study: No prior studies. Performing Technologist: Jean Rosenthal RDMS,RVT  Examination Guidelines: A complete evaluation includes B-mode imaging, spectral Doppler, color Doppler, and  power Doppler as needed of all accessible portions of each vessel. Bilateral testing is considered an integral part of a complete examination. Limited examinations for reoccurring indications may be performed as noted. The reflux portion of the exam is performed with the patient in reverse Trendelenburg.  +-----+---------------+---------+-----------+----------+--------------+ RIGHTCompressibilityPhasicitySpontaneityPropertiesThrombus Aging +-----+---------------+---------+-----------+----------+--------------+ CFV  Full           Yes      Yes                                 +-----+---------------+---------+-----------+----------+--------------+   +---------+---------------+---------+-----------+----------+-------------------+ LEFT     CompressibilityPhasicitySpontaneityPropertiesThrombus Aging      +---------+---------------+---------+-----------+----------+-------------------+ CFV      Full           Yes      Yes                                      +---------+---------------+---------+-----------+----------+-------------------+ SFJ      Full                                                             +---------+---------------+---------+-----------+----------+-------------------+ FV Prox  Full                                                             +---------+---------------+---------+-----------+----------+-------------------+  FV Mid   Full                                                             +---------+---------------+---------+-----------+----------+-------------------+ FV DistalFull                                                             +---------+---------------+---------+-----------+----------+-------------------+ PFV      Full                                                             +---------+---------------+---------+-----------+----------+-------------------+ POP      Full           Yes      Yes                                       +---------+---------------+---------+-----------+----------+-------------------+ PTV      Partial                                      Age Indeterminate   +---------+---------------+---------+-----------+----------+-------------------+ PERO                                                  Not well visualized +---------+---------------+---------+-----------+----------+-------------------+     Summary: RIGHT: - No evidence of common femoral vein obstruction.  - Ultrasound characteristics of enlarged lymph nodes are noted in the groin.  LEFT: - Findings consistent with age indeterminate deep vein thrombosis involving a single left posterior tibial vein.  - No cystic structure found in the popliteal fossa.  *See table(s) above for measurements and observations. Electronically signed by Sherald Hess MD on 09/22/2020 at 4:25:48 PM.    Final     Scheduled Meds: . apixaban  10 mg Oral BID   Followed by  . [START ON 09/30/2020] apixaban  5 mg Oral BID  . atorvastatin  40 mg Oral Daily  . brimonidine  1 drop Both Eyes q AM  . [START ON 09/24/2020] cholecalciferol  50,000 Units Oral Q Tue  . diltiazem  240 mg Oral q AM  . dorzolamide-timolol  1 drop Both Eyes q AM  . escitalopram  5 mg Oral q AM  . gabapentin  100 mg Oral q AM  . levothyroxine  75 mcg Oral QAC breakfast  . losartan  100 mg Oral q AM  . pantoprazole  40 mg Oral q AM  . sucralfate  1 g Oral BID   Continuous Infusions:   LOS: 0 days    Time spent: 35 mins    Cipriano Bunker, MD [facility]   If 7PM-7AM, please  contact night-coverage

## 2020-09-23 NOTE — ED Notes (Signed)
Pt clean and brief changed.  Small skin tear near rectum noted on left side, small amount of blood noted.  Family states pt has hx of large skin tear in the area, inpatient RN notified prior to pt being transported

## 2020-09-23 NOTE — Discharge Instructions (Signed)
Information on my medicine - ELIQUIS (apixaban)  Why was Eliquis prescribed for you? Eliquis was prescribed to treat blood clots that may have been found in the veins of your legs (deep vein thrombosis) or in your lungs (pulmonary embolism) and to reduce the risk of them occurring again.  What do You need to know about Eliquis ? The starting dose is 10 mg (two 5 mg tablets) taken TWICE daily for the FIRST SEVEN (7) DAYS, then on 09/30/2020  the dose is reduced to ONE 5 mg tablet taken TWICE daily.  Eliquis may be taken with or without food.   Try to take the dose about the same time in the morning and in the evening. If you have difficulty swallowing the tablet whole please discuss with your pharmacist how to take the medication safely.  Take Eliquis exactly as prescribed and DO NOT stop taking Eliquis without talking to the doctor who prescribed the medication.  Stopping may increase your risk of developing a new blood clot.  Refill your prescription before you run out.  After discharge, you should have regular check-up appointments with your healthcare provider that is prescribing your Eliquis.    What do you do if you miss a dose? If a dose of ELIQUIS is not taken at the scheduled time, take it as soon as possible on the same day and twice-daily administration should be resumed. The dose should not be doubled to make up for a missed dose.  Important Safety Information A possible side effect of Eliquis is bleeding. You should call your healthcare provider right away if you experience any of the following: ? Bleeding from an injury or your nose that does not stop. ? Unusual colored urine (red or dark brown) or unusual colored stools (red or black). ? Unusual bruising for unknown reasons. ? A serious fall or if you hit your head (even if there is no bleeding).  Some medicines may interact with Eliquis and might increase your risk of bleeding or clotting while on Eliquis. To help  avoid this, consult your healthcare provider or pharmacist prior to using any new prescription or non-prescription medications, including herbals, vitamins, non-steroidal anti-inflammatory drugs (NSAIDs) and supplements.  This website has more information on Eliquis (apixaban): http://www.eliquis.com/eliquis/home

## 2020-09-23 NOTE — Progress Notes (Signed)
Patient would like to hear from neuro after they round if possible please call

## 2020-09-23 NOTE — ED Notes (Signed)
Pt transported to MRI 

## 2020-09-24 ENCOUNTER — Inpatient Hospital Stay (HOSPITAL_COMMUNITY): Payer: Medicare Other

## 2020-09-24 DIAGNOSIS — I2699 Other pulmonary embolism without acute cor pulmonale: Secondary | ICD-10-CM | POA: Diagnosis present

## 2020-09-24 DIAGNOSIS — Z91013 Allergy to seafood: Secondary | ICD-10-CM | POA: Diagnosis not present

## 2020-09-24 DIAGNOSIS — F0281 Dementia in other diseases classified elsewhere with behavioral disturbance: Secondary | ICD-10-CM

## 2020-09-24 DIAGNOSIS — Z79890 Hormone replacement therapy (postmenopausal): Secondary | ICD-10-CM | POA: Diagnosis not present

## 2020-09-24 DIAGNOSIS — I480 Paroxysmal atrial fibrillation: Secondary | ICD-10-CM | POA: Diagnosis present

## 2020-09-24 DIAGNOSIS — S8252XD Displaced fracture of medial malleolus of left tibia, subsequent encounter for closed fracture with routine healing: Secondary | ICD-10-CM | POA: Diagnosis not present

## 2020-09-24 DIAGNOSIS — Z888 Allergy status to other drugs, medicaments and biological substances status: Secondary | ICD-10-CM | POA: Diagnosis not present

## 2020-09-24 DIAGNOSIS — I82442 Acute embolism and thrombosis of left tibial vein: Secondary | ICD-10-CM | POA: Diagnosis present

## 2020-09-24 DIAGNOSIS — W19XXXD Unspecified fall, subsequent encounter: Secondary | ICD-10-CM | POA: Diagnosis present

## 2020-09-24 DIAGNOSIS — I639 Cerebral infarction, unspecified: Secondary | ICD-10-CM

## 2020-09-24 DIAGNOSIS — G309 Alzheimer's disease, unspecified: Secondary | ICD-10-CM | POA: Diagnosis present

## 2020-09-24 DIAGNOSIS — G3184 Mild cognitive impairment, so stated: Secondary | ICD-10-CM | POA: Diagnosis not present

## 2020-09-24 DIAGNOSIS — Z880 Allergy status to penicillin: Secondary | ICD-10-CM | POA: Diagnosis not present

## 2020-09-24 DIAGNOSIS — I69398 Other sequelae of cerebral infarction: Secondary | ICD-10-CM | POA: Diagnosis not present

## 2020-09-24 DIAGNOSIS — Z7982 Long term (current) use of aspirin: Secondary | ICD-10-CM | POA: Diagnosis not present

## 2020-09-24 DIAGNOSIS — I447 Left bundle-branch block, unspecified: Secondary | ICD-10-CM | POA: Diagnosis present

## 2020-09-24 DIAGNOSIS — Z20822 Contact with and (suspected) exposure to covid-19: Secondary | ICD-10-CM | POA: Diagnosis present

## 2020-09-24 DIAGNOSIS — E039 Hypothyroidism, unspecified: Secondary | ICD-10-CM | POA: Diagnosis present

## 2020-09-24 DIAGNOSIS — Z79899 Other long term (current) drug therapy: Secondary | ICD-10-CM | POA: Diagnosis not present

## 2020-09-24 DIAGNOSIS — R4182 Altered mental status, unspecified: Secondary | ICD-10-CM | POA: Diagnosis present

## 2020-09-24 DIAGNOSIS — Z87891 Personal history of nicotine dependence: Secondary | ICD-10-CM | POA: Diagnosis not present

## 2020-09-24 DIAGNOSIS — I2693 Single subsegmental pulmonary embolism without acute cor pulmonale: Secondary | ICD-10-CM

## 2020-09-24 DIAGNOSIS — Z66 Do not resuscitate: Secondary | ICD-10-CM | POA: Diagnosis present

## 2020-09-24 DIAGNOSIS — I10 Essential (primary) hypertension: Secondary | ICD-10-CM | POA: Diagnosis present

## 2020-09-24 DIAGNOSIS — E785 Hyperlipidemia, unspecified: Secondary | ICD-10-CM | POA: Diagnosis present

## 2020-09-24 DIAGNOSIS — K219 Gastro-esophageal reflux disease without esophagitis: Secondary | ICD-10-CM | POA: Diagnosis present

## 2020-09-24 DIAGNOSIS — Z833 Family history of diabetes mellitus: Secondary | ICD-10-CM | POA: Diagnosis not present

## 2020-09-24 DIAGNOSIS — Z885 Allergy status to narcotic agent status: Secondary | ICD-10-CM | POA: Diagnosis not present

## 2020-09-24 DIAGNOSIS — H539 Unspecified visual disturbance: Secondary | ICD-10-CM | POA: Diagnosis present

## 2020-09-24 LAB — CBC
HCT: 26.7 % — ABNORMAL LOW (ref 36.0–46.0)
Hemoglobin: 8.5 g/dL — ABNORMAL LOW (ref 12.0–15.0)
MCH: 26.6 pg (ref 26.0–34.0)
MCHC: 31.8 g/dL (ref 30.0–36.0)
MCV: 83.7 fL (ref 80.0–100.0)
Platelets: 368 10*3/uL (ref 150–400)
RBC: 3.19 MIL/uL — ABNORMAL LOW (ref 3.87–5.11)
RDW: 16 % — ABNORMAL HIGH (ref 11.5–15.5)
WBC: 5.2 10*3/uL (ref 4.0–10.5)
nRBC: 0 % (ref 0.0–0.2)

## 2020-09-24 LAB — BASIC METABOLIC PANEL
Anion gap: 7 (ref 5–15)
BUN: 19 mg/dL (ref 8–23)
CO2: 23 mmol/L (ref 22–32)
Calcium: 8.1 mg/dL — ABNORMAL LOW (ref 8.9–10.3)
Chloride: 104 mmol/L (ref 98–111)
Creatinine, Ser: 1.08 mg/dL — ABNORMAL HIGH (ref 0.44–1.00)
GFR, Estimated: 50 mL/min — ABNORMAL LOW (ref >60)
Glucose, Bld: 96 mg/dL (ref 70–99)
Potassium: 3.8 mmol/L (ref 3.5–5.1)
Sodium: 134 mmol/L — ABNORMAL LOW (ref 135–145)

## 2020-09-24 LAB — HEMOGLOBIN AND HEMATOCRIT, BLOOD
HCT: 29.6 % — ABNORMAL LOW (ref 36.0–46.0)
Hemoglobin: 9.1 g/dL — ABNORMAL LOW (ref 12.0–15.0)

## 2020-09-24 LAB — MAGNESIUM: Magnesium: 2.2 mg/dL (ref 1.7–2.4)

## 2020-09-24 LAB — PHOSPHORUS: Phosphorus: 3.9 mg/dL (ref 2.5–4.6)

## 2020-09-24 MED ORDER — VITAMIN D (ERGOCALCIFEROL) 1.25 MG (50000 UNIT) PO CAPS
50000.0000 [IU] | ORAL_CAPSULE | ORAL | Status: DC
  Administered 2020-09-24: 50000 [IU] via ORAL
  Filled 2020-09-24: qty 1

## 2020-09-24 MED ORDER — SENNA 8.6 MG PO TABS
1.0000 | ORAL_TABLET | Freq: Two times a day (BID) | ORAL | Status: DC
  Administered 2020-09-24 – 2020-09-27 (×5): 8.6 mg via ORAL
  Filled 2020-09-24 (×6): qty 1

## 2020-09-24 MED ORDER — QUETIAPINE FUMARATE 25 MG PO TABS
25.0000 mg | ORAL_TABLET | Freq: Every day | ORAL | Status: DC
  Administered 2020-09-24 – 2020-09-26 (×3): 25 mg via ORAL
  Filled 2020-09-24 (×3): qty 1

## 2020-09-24 NOTE — Consult Note (Addendum)
Neurology Consultation  Reason for Consult: Altered mental status, hallucinations Referring Physician: Dr. [name]  CC: Altered mental status, hallucinations  History is obtained from: Patient, chart, patient's daughter Tammy Jones at the bedside  HPI: Tammy Jones is a 87 y.o. female past medical history of paroxysmal atrial fibrillation who was prior to arrival not anticoagulation due to risk for recurrent falls, hypertension, prior stroke in 2020 with left PCA involvement and right hemianopsia, recent stroke in March 2022 with some left-sided visual deficits due to right PCA stroke, presenting for evaluation of altered mental status and hallucinations She has had episodes of various different neurological "spells" over the past few years with no clear answers and had a stroke in 2020 which left her with right hemianopsia and then again in 2022 March which left her with left hemianopsia-she was off of anticoagulation for the risk of falls that she continues to consume alcohol 1-2 drinks a day. She was discharged home from [place] and was doing fine for the few days past discharge but then started having hallucinations.  She describes these hallucinations towards the right of her visual fields, in the area that she has hemianopsia, usually seen same kind of people/or families that are there at sometimes and disappear at other times. She has no previous psychiatric history.  Memory has been declining somewhat over the past year but this change in the past 3 weeks has been very abrupt.  She denies any tingling numbness or weakness at this time.  She denies any chest pain shortness of breath or nausea vomiting.  Found to have a PE this admission-started on anticoagulation.  ROS: Performed and negative except as noted in HPI  Past Medical History:  Diagnosis Date  . Alzheimer's disease (HCC)   . Atrial fibrillation (HCC)   . 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 GERD (gastroesophageal reflux disease)   . Hyperlipidemia   . Hypertension         Family History  Problem Relation Age of Onset  . Diabetes Sister   . Stroke Neg Hx      Social History:   reports that she has quit smoking. She has a 10.00 pack-year smoking history. She does not have any smokeless tobacco history on file. She reports that she does not drink alcohol and does not use drugs.  Medications  Current Facility-Administered Medications:  .  acetaminophen (TYLENOL) tablet 650 mg, 650 mg, Oral, Q6H PRN, 650 mg at 09/24/20 0810 **OR** acetaminophen (TYLENOL) suppository 650 mg, 650 mg, Rectal, Q6H PRN, Hillary Bow, DO .  apixaban (ELIQUIS) tablet 10 mg, 10 mg, Oral, BID, 10 mg at 09/24/20 0811 **FOLLOWED BY** [START ON 09/30/2020] apixaban (ELIQUIS) tablet 5 mg, 5 mg, Oral, BID, Koleen Distance, MD .  atorvastatin (LIPITOR) tablet 40 mg, 40 mg, Oral, Daily, Koleen Distance, MD, 40 mg at 09/24/20 6301 .  brimonidine (ALPHAGAN) 0.2 % ophthalmic solution 1 drop, 1 drop, Both Eyes, q AM, Koleen Distance, MD, 1 drop at 09/24/20 [phone] .  diltiazem (CARDIZEM CD) 24 hr capsule 240 mg, 240 mg, Oral, q AM, Koleen Distance, MD, 240 mg at 09/24/20 0811 .  dorzolamide-timolol (COSOPT) 22.3-6.8 MG/ML ophthalmic solution 1 drop, 1 drop, Both Eyes, q AM, Koleen Distance, MD, 1 drop at 09/24/20 [phone] .  escitalopram (LEXAPRO) tablet 5 mg, 5 mg, Oral, q AM, Koleen Distance, MD, 5 mg at 09/24/20 0810 .  gabapentin (  NEURONTIN) capsule 100 mg, 100 mg, Oral, q AM, Koleen Distance, MD, 100 mg at 09/24/20 0811 .  levothyroxine (SYNTHROID) tablet 75 mcg, 75 mcg, Oral, QAC breakfast, Koleen Distance, MD, 75 mcg at 09/24/20 0810 .  LORazepam (ATIVAN) injection 1 mg, 1 mg, Intravenous, Once PRN, Julian Reil, Jared M, DO .  losartan (COZAAR) tablet 100 mg, 100 mg, Oral, q AM, Koleen Distance, MD, 100 mg at 09/24/20 0811 .  ondansetron (ZOFRAN) tablet 4 mg, 4 mg, Oral, Q6H PRN **OR**  ondansetron (ZOFRAN) injection 4 mg, 4 mg, Intravenous, Q6H PRN, Julian Reil, Jared M, DO .  pantoprazole (PROTONIX) EC tablet 40 mg, 40 mg, Oral, q AM, Koleen Distance, MD, 40 mg at 09/24/20 0810 .  sucralfate (CARAFATE) 1 GM/10ML suspension 1 g, 1 g, Oral, BID, Koleen Distance, MD, 1 g at 09/24/20 [phone] .  Vitamin D (Ergocalciferol) (DRISDOL) capsule 50,000 Units, 50,000 Units, Oral, Q7 days, Cipriano Bunker, MD, 50,000 Units at 09/24/20 1021   Exam: Current vital signs: BP (!) 148/80 (BP Location: Right Arm)   Pulse 65   Temp 98.7 F (37.1 C) (Oral)   Resp 17   SpO2 98%  Vital signs in last 24 hours: Temp:  [98 F (36.7 C)-98.7 F (37.1 C)] 98.7 F (37.1 C) (04/05 0811) Pulse Rate:  [60-65] 65 (04/05 0811) Resp:  [17-19] 17 (04/05 0811) BP: (127-150)/(65-82) 148/80 (04/05 0811) SpO2:  [95 %-99 %] 98 % (04/05 0811) General: Awake alert in no distress HEENT: Normocephalic atraumatic Lungs: Clear Cardiovascular: Regular rate regular Abdomen nondistended nontender Extremities: Left lower extremity wrapped in bandage and immobilizer. Neurological exam Awake alert oriented to self and the fact that she is in the hospital Could not tell me the correct month or date. Could not tell me her correct age.  Was able to tell me her correct date of birth. Was able to tell me how many children she had, their names but could not tell me their exact years of birth. Could not tell me the current president but was able to say that he defeated the prior president who she did not like. Repetition intact Comprehension intact Fluency preserved No dysarthria Cranial nerves: Pupils are equal round reactive to light, extraocular movements are unrestricted, I think her visual acuity is severely diminished or she has complete visual loss as she could not really consistently find my fingers in most visual fields, facial symmetry and sensation are preserved, auditory acuity is mildly reduced, palate elevates  symmetrically, shoulder shrug intact, tongue midline. Motor exam: Antigravity in all fours with the exception of the left leg which is in an immobilizer. Sensory exam: Intact to touch all over Coordination: No dysmetria on finger-nose-finger  Labs I have reviewed labs in epic and the results pertinent to this consultation are:   CBC    Component Value Date/Time   WBC 5.2 09/24/2020 0340   RBC 3.19 (L) 09/24/2020 0340   HGB 8.5 (L) 09/24/2020 0340   HCT 26.7 (L) 09/24/2020 0340   PLT 368 09/24/2020 0340   MCV 83.7 09/24/2020 0340   MCH 26.6 09/24/2020 0340   MCHC 31.8 09/24/2020 0340   RDW 16.0 (H) 09/24/2020 0340   LYMPHSABS 0.9 09/22/2020 1352   MONOABS 0.8 09/22/2020 1352   EOSABS 0.0 09/22/2020 1352   BASOSABS 0.0 09/22/2020 1352    CMP     Component Value Date/Time   NA 134 (L) 09/24/2020 0340   K 3.8 09/24/2020 0340   CL 104  09/24/2020 0340   CO2 23 09/24/2020 0340   GLUCOSE 96 09/24/2020 0340   BUN 19 09/24/2020 0340   CREATININE 1.08 (H) 09/24/2020 0340   CALCIUM 8.1 (L) 09/24/2020 0340   PROT 6.1 (L) 09/22/2020 1352   ALBUMIN 2.1 (L) 09/22/2020 1352   AST 32 09/22/2020 1352   ALT 30 09/22/2020 1352   ALKPHOS 97 09/22/2020 1352   BILITOT 0.7 09/22/2020 1352   GFRNONAA 50 (L) 09/24/2020 0340   Imaging I have reviewed the images obtained:  CT-scan of the brain-evolving left PCA stroke.  Generalized volume loss.  MRI examination of the brain-no acute intracranial abnormality.  Expected evolution of the right PCA infarct and left PCA infarct encephalomalacia.  Assessment:  87 year old with past medical history of paroxysmal atrial fibrillation initially took off of anticoagulation for fall risk, recently started on anticoagulation due to PE, prior left PCA stroke with right hemianopsia and a recent right PCA stroke with some more visual deficits, presenting for evaluation of altered mental status and hallucinations. 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 Delirium precautions PT OT  Follow-up with outpatient neurology -neurology 4 to 6 weeks for stroke follow-up as well as formal neuropsych evaluation. Follow-up with outpatient ophthalmology-1 to 2 weeks Detailed discussion held with family member-daughter at bedside.  I showed her the images from [place] as well as MRI images obtained here at [facility] and answered all her questions.  I also gave her an article on Maureen Ralphs syndrome from [facility].  Plan relayed to Dr. Lucianne Muss via secure chat Please call neurology with questions-we will be available as needed.  -- Milon Dikes, MD Neurologist [facility] Pager: [phone]

## 2020-09-24 NOTE — TOC Benefit Eligibility Note (Signed)
Transition of Care [facility]) Benefit Eligibility Note    Patient Details  Name: Tammy Jones MRN: [id number] Date of Birth: January 26, 1932   Medication/Dose: ELIQUIS  2.5 MG BID -CO-PAY-$47.00  and  ELIQUIS 5 MG BID-CO-PAY-$47.00  Covered?: Yes  Tier: 3 Drug  Prescription Coverage Preferred Pharmacy: [facility]  Spoke with Person/Company/Phone Number:: LUCKY  @  [employer] #  [phone]  Co-Pay: $47.00  Prior Approval: No  Deductible: Unmet (OUT-OF-POCKET:UNMET)  Additional Notes: APIXABAN  10 MG  : NON-FORMULARY    Mardene Sayer Phone Number: 09/24/2020, 10:13 AM

## 2020-09-24 NOTE — Plan of Care (Signed)

## 2020-09-24 NOTE — Progress Notes (Addendum)
PROGRESS NOTE    Tammy Jones  ZOX:[id number] DOB: [birth year] DOA: 09/22/2020 PCP: Everlean Cherry, MD   Brief Narrative:  This 87 yrs old female with PMH significant for dementia, prior stroke, PAF not on anticoagulation anymore due to recurrent falls, hypertension.  Patient was living with her daughter three weeks ago, had sundowning at night and was asking repetitive questions, suffered mechanical fall and broke left tibia and fibula.  She was admitted in the [facility] and had open reduction internal fixation.  Hospital course was complicated by postoperative visual hallucinations and delirium.  Patient was subsequently stabilized and discharged to skilled nursing facility for rehab,  she was doing well until yesterday when patient's daughter went to see her mom,  she was very confused and has fluctuations in her mental status.  She has brought in the ED,  work-up found out that she has small pulmonary emboli on CTA chest. CT head: showed old strokes.  Patient was admitted for delirium and pulmonary embolism, she was started on Eliquis.  MRI showed evolving stroke.  Neurology was consulted, Patient had MRI last month, showing the same,  not a new stroke.  Patient is having delirium.   Assessment & Plan:   Principal Problem:   Pulmonary embolus (HCC) Active Problems:   Occipital stroke (HCC)   Paroxysmal atrial fibrillation (HCC)   Delirium   Dementia with behavioral disturbance (HCC)   Pulmonary embolism (HCC)   Delirium on pre-existing dementia: Patient was found to have fluctuations in her mental status at nursing home. Work-up in the ED,  found to have a small PE. CT head showed evolving stroke,  MRI showed the same. Neurology consulted,  Patient had MRI last month showing the same findings, there is no new stroke,  Patient is having delirium. Neurochecks every 6 hours. Frequent re-orientation Mental status changes may be due to patient being in new unfamiliar environment  and pre-existing dementia. Psych consulted, awaiting recommendation. Patient reports intermittent hallucinations. Patient does have history of occipital stroke. Patient is started on Eliquis instead for PE, aspirin and Plavix kept on hold. New stroke ruled out, MRI showed chronic left PCA and bilateral cerebellar territory infarcts. PT/ OT evaluation.  Pulmonary embolism: Patient is found to have small volume PE, from posterior tibial vein DVT seen on ultrasound. Continue telemetry. 2D Echo: Abnormal septal motion ? from LBBB. LVEF is 50 to 55%. The left ventricle has low normal  function. The left ventricle has no regional wall motion abnormalities.  There is mild left ventricular hypertrophy We will continue Eliquis. Monitor H&H.  Daughter reports history of black stools in the past. Patient is also at high risk for falls.  Hypertension : Continue  Losartan. BP controlled.  Paroxysmal A. fib.   Heart rate is controlled. She was not on any anticoagulation given risk of falls. But now patient started on Eliquis given PE and DVT. Continue Cardizem CD 240 mg daily  Left ankle fracture: Continue supportive care. Adequate pain control.  Hypothyroidism: Continue levothyroxine.  DVT prophylaxis: Eliquis Code Status: DNR Family Communication: Daughter at bed side. Disposition Plan:   Status is: Inpatient  Remains inpatient appropriate because:Inpatient level of care appropriate due to severity of illness   Dispo: The patient is from:SNF              Anticipated d/c is to: SNF              Patient currently is not medically stable to d/c.   Difficult  to place patient No    Consultants:   Neurology  Procedures: Echocardiogram Antimicrobials:   Anti-infectives (From admission, onward)   None      Subjective: Patient was seen and examined at bedside.  Overnight events noted.   Patient is alert, awake, reports feeling better, Patient denies any suicidal / homicidal  ideations.   Patient reports intermittent hallucinations.  Objective: Vitals:   09/23/20 1525 09/23/20 2032 09/24/20 0517 09/24/20 0811  BP: 127/72 (!) 145/82 (!) 150/65 (!) 148/80  Pulse: 60 61 65 65  Resp: Temp: 98.2 F (36.8 C) 98 F (36.7 C) 98 F (36.7 C) 98.7 F (37.1 C)  TempSrc: Oral Oral Oral Oral  SpO2: 97% 99% 95% 98%    Intake/Output Summary (Last 24 hours) at 09/24/2020 1137 Last data filed at 09/23/2020 2057 Gross per 24 hour  Intake --  Output 250 ml  Net -250 ml   There were no vitals filed for this visit.  Examination:  General exam: Appears calm and comfortable, not in any acute distress. Respiratory system: Clear to auscultation. Respiratory effort normal. Cardiovascular system: S1 & S2 heard, RRR. No JVD, murmurs, rubs, gallops or clicks. No pedal edema. Gastrointestinal system: Abdomen is nondistended, soft and nontender. No organomegaly or masses felt.  Normal bowel sounds heard. Central nervous system: Alert and oriented. No focal neurological deficits. Extremities:  No edema, no cyanosis, no clubbing. Skin: No rashes, lesions or ulcers Psychiatry: Judgement and insight appear normal. Mood & affect appropriate.     Data Reviewed: I have personally reviewed following labs and imaging studies  CBC: Recent Labs  Lab 09/22/20 1352 09/23/20 0410 09/24/20 0340  WBC 6.8 6.2 5.2  NEUTROABS 5.0  --   --   HGB 9.3* 8.9* 8.5*  HCT 29.0* 27.9* 26.7*  MCV 83.1 83.5 83.7  PLT 345 321 368   Basic Metabolic Panel: Recent Labs  Lab 09/22/20 1352 09/23/20 0410 09/24/20 0340  NA 135 135 134*  K 3.6 3.5 3.8  CL 105 105 104  CO2 GLUCOSE 125* 110* 96  BUN CREATININE 0.88 0.88 1.08*  CALCIUM 8.1* 8.3* 8.1*  MG  --   --  2.2  PHOS  --   --  3.9   GFR: CrCl cannot be calculated (Unknown ideal weight.). Liver Function Tests: Recent Labs  Lab 09/22/20 1352  AST 32  ALT 30  ALKPHOS 97  BILITOT 0.7  PROT 6.1*   ALBUMIN 2.1*   No results for input(s): LIPASE, AMYLASE in the last 168 hours. No results for input(s): AMMONIA in the last 168 hours. Coagulation Profile: No results for input(s): INR, PROTIME in the last 168 hours. Cardiac Enzymes: No results for input(s): CKTOTAL, CKMB, CKMBINDEX, TROPONINI in the last 168 hours. BNP (last 3 results) No results for input(s): PROBNP in the last 8760 hours. HbA1C: No results for input(s): HGBA1C in the last 72 hours. CBG: Recent Labs  Lab 09/22/20 1251  GLUCAP 125*   Lipid Profile: No results for input(s): CHOL, HDL, LDLCALC, TRIG, CHOLHDL, LDLDIRECT in the last 72 hours. Thyroid Function Tests: No results for input(s): TSH, T4TOTAL, FREET4, T3FREE, THYROIDAB in the last 72 hours. Anemia Panel: No results for input(s): VITAMINB12, FOLATE, FERRITIN, TIBC, IRON, RETICCTPCT in the last 72 hours. Sepsis Labs: Recent Labs  Lab 09/22/20 1353  LATICACIDVEN 0.9    Recent Results (from the past 240 hour(s))  Urine culture  Status: None (Preliminary result)   Collection Time: 09/22/20  8:56 PM   Specimen: Urine, Clean Catch  Result Value Ref Range Status   Specimen Description URINE, CLEAN CATCH  Final   Special Requests NONE  Final   Culture   Final    CULTURE REINCUBATED FOR BETTER GROWTH Performed at [facility], [address], [place], [place] [address]    Report Status PENDING  Incomplete  SARS CORONAVIRUS 2 (TAT 6-24 HRS) Nasopharyngeal Nasopharyngeal Swab     Status: None   Collection Time: 09/23/20 12:18 AM   Specimen: Nasopharyngeal Swab  Result Value Ref Range Status   SARS Coronavirus 2 NEGATIVE NEGATIVE Final    Comment: (NOTE) SARS-CoV-2 target nucleic acids are NOT DETECTED.  The SARS-CoV-2 RNA is generally detectable in upper and lower respiratory specimens during the acute phase of infection. Negative results do not preclude SARS-CoV-2 infection, do not rule out co-infections with other pathogens, and should  not be used as the sole basis for treatment or other patient management decisions. Negative results must be combined with clinical observations, patient history, and epidemiological information. The expected result is Negative.  Fact Sheet for Patients: HairSlick.no  Fact Sheet for Healthcare Providers: quierodirigir.com  This test is not yet approved or cleared by the [place] FDA and  has been authorized for detection and/or diagnosis of SARS-CoV-2 by FDA under an Emergency Use Authorization (EUA). This EUA will remain  in effect (meaning this test can be used) for the duration of the COVID-19 declaration under Se ction 564(b)(1) of the Act, 21 U.S.C. section 360bbb-3(b)(1), unless the authorization is terminated or revoked sooner.  Performed at [facility], [address], [place], [place] [address]     Radiology Studies: DG Wrist Complete Left  Result Date: 09/22/2020 CLINICAL DATA:  Fall 1 week ago. EXAM: LEFT WRIST - COMPLETE 3+ VIEW COMPARISON:  12/19/2019 FINDINGS: Moderate degenerative changes of the radiocarpal joint and distal radioulnar joint as well as the carpal bones and first carpometacarpal joints. Degenerative change over the first MTP joint and interphalangeal joint. No acute fracture or dislocation. IMPRESSION: No acute findings. Electronically Signed   By: Elberta Fortis M.D.   On: 09/22/2020 15:07   DG Ankle Complete Left  Result Date: 09/22/2020 CLINICAL DATA:  Fall 1 week ago with left ankle pain. EXAM: LEFT ANKLE COMPLETE - 3+ VIEW COMPARISON:  09/04/2020 and 09/06/2020 FINDINGS: Fixation hardware unchanged in intact bridging patient's distal fibular fracture and medial malleolar fracture. Single screw unchanged bridging the distal tibia/fibular joint. Remainder of the exam is unchanged. IMPRESSION: No acute findings. Electronically Signed   By: Elberta Fortis M.D.   On: 09/22/2020 15:10   DG  Abdomen 1 View  Result Date: 09/23/2020 CLINICAL DATA:  Altered mental status. EXAM: ABDOMEN - 1 VIEW COMPARISON:  None. FINDINGS: The bowel gas pattern is normal. No radio-opaque calculi or other significant radiographic abnormality are seen. IMPRESSION: Negative. Electronically Signed   By: Katherine Mantle M.D.   On: 09/23/2020 01:42   CT Head Wo Contrast  Result Date: 09/22/2020 CLINICAL DATA:  Mental status change. EXAM: CT HEAD WITHOUT CONTRAST TECHNIQUE: Contiguous axial images were obtained from the base of the skull through the vertex without intravenous contrast. COMPARISON:  September 12, 2020. FINDINGS: Brain: Evolving encephalomalacia in the right occipital and medial temporal lobe. No definite new acute large vascular territory infarct. No acute hemorrhage. Similar remote left PCA and left PICA territory infarcts and chronic lacunar  infarcts in bilateral thalami and right cerebellum. No midline shift. No hydrocephalus. Additional patchy white matter hypoattenuation, nonspecific but most likely related to chronic microvascular ischemic disease. Vascular: Calcific atherosclerosis. No hyperdense vessel identified. Skull: No acute fracture. Sinuses/Orbits: No acute findings. Other: No mastoid effusions. IMPRESSION: 1. Evolving encephalomalacia in the right occipital and medial temporal lobes, corresponding to areas of infarcts seen on recent MRI. No evidence of interval acute intracranial abnormality, although MRI could provide more sensitive evaluation for interval acute infarct if clinically indicated. 2. Similar remote left PCA and left PICA territory infarcts and chronic lacunar infarcts in bilateral thalami and right cerebellum. Electronically Signed   By: Feliberto Harts MD   On: 09/22/2020 16:55   CT Angio Chest PE W and/or Wo Contrast  Result Date: 09/22/2020 CLINICAL DATA:  Chest pain and shortness of breath. EXAM: CT ANGIOGRAPHY CHEST WITH CONTRAST TECHNIQUE: Multidetector CT imaging of  the chest was performed using the standard protocol during bolus administration of intravenous contrast. Multiplanar CT image reconstructions and MIPs were obtained to evaluate the vascular anatomy. CONTRAST:  94mL OMNIPAQUE IOHEXOL 350 MG/ML SOLN COMPARISON:  None. FINDINGS: Cardiovascular: There is moderate severity calcification of the aortic arch and descending thoracic aorta, without evidence of aneurysmal dilatation. A small amount of intraluminal low attenuation is seen within and inferior medial right upper lobe branch of the right pulmonary artery (axial CT images 69 and 70, CT series number 5). Normal heart size. No pericardial effusion. Mediastinum/Nodes: No enlarged mediastinal, hilar, or axillary lymph nodes. Thyroid gland, trachea, and esophagus demonstrate no significant findings. Lungs/Pleura: Very mild right apical scarring and/or atelectasis is seen. There is no evidence of acute infiltrate, pleural effusion or pneumothorax. Upper Abdomen: A 1.8 cm x 1.1 cm low-attenuation right adrenal mass is seen. The left adrenal gland is nodular in appearance. Musculoskeletal: A chronic deformity is seen involving the body of the sternum. Metallic density fixation screws are seen within the right humeral head. Multilevel degenerative changes are noted throughout the thoracic spine. Chronic loss of vertebral body height is also seen involving multiple mid to lower thoracic spine vertebral bodies. Review of the MIP images confirms the above findings. IMPRESSION: 1. Small amount of pulmonary embolism within a right upper lobe branch of the right pulmonary artery. 2. Low attenuation right adrenal mass which may represent an adrenal adenoma. 3. Postoperative changes involving proximal right humerus. 4. Aortic atherosclerosis. Aortic Atherosclerosis (ICD10-I70.0). Electronically Signed   By: Aram Candela M.D.   On: 09/22/2020 22:10   MR BRAIN WO CONTRAST  Result Date: 09/23/2020 CLINICAL DATA:  87 year old  female with altered mental status. 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 EXAM: MRI HEAD WITHOUT CONTRAST TECHNIQUE: Multiplanar, multiecho pulse sequences of the brain and surrounding structures were obtained without intravenous contrast. COMPARISON:  Head CT 09/22/2020. [facility] brain MRI 09/12/2020. FINDINGS: Study is intermittently degraded by motion artifact despite repeated imaging attempts. Brain: Resolved right PCA territory restricted diffusion since the prior MRI. Developing encephalomalacia in the medial right occipital lobe now. Contralateral chronic left PCA territory encephalomalacia. Punctate residual abnormal diffusion in the posterior right corona radiata which was present previously. No new areas of abnormal diffusion. No midline shift, mass effect, evidence of mass lesion, ventriculomegaly, extra-axial collection or acute intracranial hemorrhage. Cervicomedullary junction within normal limits. Punctate T1 hyperintense area along the pituitary infundibulum appears stable and benign. Patchy and scattered bilateral cerebral white matter T2 and FLAIR hyperintensity is stable. Moderate similar T2 heterogeneity in the pons. Chronic left  thalamic and bilateral cerebellar infarcts are stable. No significant chronic blood products. No new signal abnormality. Vascular: Major intracranial vascular flow voids appear stable from last month. Skull and upper cervical spine: Stable and negative. Sinuses/Orbits: Stable and negative. Other: Mastoids remain clear. IMPRESSION: 1. No acute intracranial abnormality. 2. Expected evolution of the Right PCA territory infarct seen last month. And fading small white matter lacunar infarct in the posterior right corona radiata. 3. Underlying chronic white matter disease, chronic left PCA and bilateral cerebellar territory infarcts. Electronically Signed   By: Odessa Fleming M.D.   On: 09/23/2020 04:30   DG Chest Port 1 View  Result Date: 09/22/2020 CLINICAL DATA:   Evaluate for pneumonia. EXAM: PORTABLE CHEST 1 VIEW COMPARISON:  09/04/2020 FINDINGS: Lungs are adequately inflated without focal airspace consolidation, effusion or pneumothorax. Cardiomediastinal silhouette and remainder of the exam is unchanged. IMPRESSION: No active disease. Electronically Signed   By: Elberta Fortis M.D.   On: 09/22/2020 15:06   ECHOCARDIOGRAM COMPLETE  Result Date: 09/23/2020    ECHOCARDIOGRAM REPORT   Patient Name:   PERSEPHONIE HEGWOOD Date of Exam: 09/23/2020 Medical Rec #:  [id number]        Height:       64.0 in Accession #:    [id number]       Weight:       165.0 lb Date of Birth:  01-30-1932        BSA:          1.802 m Patient Age:    89 years         BP:           164/84 mmHg Patient Gender: F                HR:           64 bpm. Exam Location:  Inpatient Procedure: 2D Echo, Cardiac Doppler and Color Doppler Indications:    Pulmonary embolus  History:        Patient has no prior history of Echocardiogram examinations.                 Stroke, Arrythmias:Atrial Fibrillation; Risk                 Factors:Hypertension and Dyslipidemia.  Sonographer:    Neomia Dear RDCS Referring Phys: [id number] JARED M GARDNER IMPRESSIONS  1. Abnormal septal motion ? from LBBB. Left ventricular ejection fraction, by estimation, is 50 to 55%. The left ventricle has low normal function. The left ventricle has no regional wall motion abnormalities. There is mild left ventricular hypertrophy.  Left ventricular diastolic parameters are indeterminate.  2. Right ventricular systolic function is normal. The right ventricular size is normal. There is normal pulmonary artery systolic pressure.  3. Left atrial size was moderately dilated.  4. The mitral valve is degenerative. No evidence of mitral valve regurgitation. No evidence of mitral stenosis. Severe mitral annular calcification.  5. The aortic valve was not well visualized. There is moderate calcification of the aortic valve. Aortic valve regurgitation is trivial.  Mild to moderate aortic valve sclerosis/calcification is present, without any evidence of aortic stenosis.  6. The inferior vena cava is normal in size with greater than 50% respiratory variability, suggesting right atrial pressure of 3 mmHg. FINDINGS  Left Ventricle: Abnormal septal motion ? from LBBB. Left ventricular ejection fraction, by estimation, is 50 to 55%. The left ventricle has low normal function. The left ventricle has no regional wall motion abnormalities.  The left ventricular internal cavity size was normal in size. There is mild left ventricular hypertrophy. Left ventricular diastolic parameters are indeterminate. Right Ventricle: The right ventricular size is normal. No increase in right ventricular wall thickness. Right ventricular systolic function is normal. There is normal pulmonary artery systolic pressure. The tricuspid regurgitant velocity is 2.01 m/s, and  with an assumed right atrial pressure of 3 mmHg, the estimated right ventricular systolic pressure is 19.2 mmHg. Left Atrium: Left atrial size was moderately dilated. Right Atrium: Right atrial size was normal in size. Pericardium: There is no evidence of pericardial effusion. Mitral Valve: The mitral valve is degenerative in appearance. Severe mitral annular calcification. No evidence of mitral valve regurgitation. No evidence of mitral valve stenosis. MV peak gradient, 6.0 mmHg. The mean mitral valve gradient is 2.0 mmHg. Tricuspid Valve: The tricuspid valve is normal in structure. Tricuspid valve regurgitation is not demonstrated. No evidence of tricuspid stenosis. Aortic Valve: The aortic valve was not well visualized. There is moderate calcification of the aortic valve. Aortic valve regurgitation is trivial. Aortic regurgitation PHT measures 585 msec. Mild to moderate aortic valve sclerosis/calcification is present, without any evidence of aortic stenosis. Aortic valve mean gradient measures 4.0 mmHg. Aortic valve peak gradient  measures 6.9 mmHg. Aortic valve area, by VTI measures 2.48 cm. Pulmonic Valve: The pulmonic valve was normal in structure. Pulmonic valve regurgitation is not visualized. No evidence of pulmonic stenosis. Aorta: The aortic root is normal in size and structure. Venous: The inferior vena cava is normal in size with greater than 50% respiratory variability, suggesting right atrial pressure of 3 mmHg. IAS/Shunts: No atrial level shunt detected by color flow Doppler.  LEFT VENTRICLE PLAX 2D LVIDd:         4.20 cm     Diastology LVIDs:         2.40 cm     LV e' medial:    4.29 cm/s LV PW:         1.50 cm     LV E/e' medial:  22.1 LV IVS:        1.20 cm     LV e' lateral:   5.17 cm/s LVOT diam:     2.00 cm     LV E/e' lateral: 18.3 LV SV:         65 LV SV Index:   36 LVOT Area:     3.14 cm  LV Volumes (MOD) LV vol d, MOD A2C: 21.6 ml LV vol d, MOD A4C: 79.9 ml LV vol s, MOD A2C: 12.3 ml LV vol s, MOD A4C: 37.5 ml LV SV MOD A2C:     9.3 ml LV SV MOD A4C:     79.9 ml LV SV MOD BP:      20.7 ml RIGHT VENTRICLE TAPSE (M-mode): 1.3 cm  PULMONARY VEINS                         A Reversal Duration: 120.00 msec                         A Reversal Velocity: 20.70 cm/s                         Diastolic Velocity:  22.60 cm/s                         S/D Velocity:  1.70                         Systolic Velocity:   39.10 cm/s LEFT ATRIUM             Index       RIGHT ATRIUM           Index LA diam:        4.50 cm 2.50 cm/m  RA Area:     12.70 cm LA Vol (A2C):   53.2 ml 29.52 ml/m RA Volume:   26.30 ml  14.59 ml/m LA Vol (A4C):   58.2 ml 32.29 ml/m LA Biplane Vol: 56.6 ml 31.40 ml/m  AORTIC VALVE                   PULMONIC VALVE AV Area (Vmax):    2.71 cm    PV Vmax:       0.61 m/s AV Area (Vmean):   2.48 cm    PV Vmean:      51.900 cm/s AV Area (VTI):     2.48 cm    PV VTI:        0.160 m AV Vmax:           131.50 cm/s PV Peak grad:  1.5 mmHg AV Vmean:          89.500 cm/s PV Mean grad:  1.0 mmHg AV VTI:            0.262  m AV Peak Grad:      6.9 mmHg AV Mean Grad:      4.0 mmHg LVOT Vmax:         113.50 cm/s LVOT Vmean:        70.550 cm/s LVOT VTI:          0.207 m LVOT/AV VTI ratio: 0.79 AI PHT:            585 msec  AORTA Ao Root diam: 3.20 cm Ao Asc diam:  2.60 cm MITRAL VALVE                TRICUSPID VALVE MV Area (PHT): 2.48 cm     TR Peak grad:   16.2 mmHg MV Area VTI:   1.66 cm     TR Vmax:        201.00 cm/s MV Peak grad:  6.0 mmHg MV Mean grad:  2.0 mmHg     SHUNTS MV Vmax:       1.22 m/s     Systemic VTI:  0.21 m MV Vmean:      71.6 cm/s    Systemic Diam: 2.00 cm MV Decel Time: 306 msec MV E velocity: 94.70 cm/s MV A velocity: 125.00 cm/s MV E/A ratio:  0.76 Charlton Haws MD Electronically signed by Charlton Haws MD Signature Date/Time: 09/23/2020/10:45:37 AM    Final    VAS [place] LOWER EXTREMITY VENOUS (DVT) ([facility] and [facility] 7a-7p)  Result Date: 09/22/2020  Lower Venous DVT Study Indications: Pain and swelling in LLE. one week s/p fall and fracture.  Limitations: Patient intolerance to compressions/pain at calf. Comparison Study: No prior studies. Performing Technologist: Jean Rosenthal RDMS,RVT  Examination Guidelines: A complete evaluation includes B-mode imaging, spectral Doppler, color Doppler, and power Doppler as needed of all accessible portions of each vessel. Bilateral testing is considered an integral part of a complete examination. Limited examinations for reoccurring indications may be performed as noted. The reflux portion of the exam  is performed with the patient in reverse Trendelenburg.  +-----+---------------+---------+-----------+----------+--------------+ RIGHTCompressibilityPhasicitySpontaneityPropertiesThrombus Aging +-----+---------------+---------+-----------+----------+--------------+ CFV  Full           Yes      Yes                                 +-----+---------------+---------+-----------+----------+--------------+    +---------+---------------+---------+-----------+----------+-------------------+ LEFT     CompressibilityPhasicitySpontaneityPropertiesThrombus Aging      +---------+---------------+---------+-----------+----------+-------------------+ CFV      Full           Yes      Yes                                      +---------+---------------+---------+-----------+----------+-------------------+ SFJ      Full                                                             +---------+---------------+---------+-----------+----------+-------------------+ FV Prox  Full                                                             +---------+---------------+---------+-----------+----------+-------------------+ FV Mid   Full                                                             +---------+---------------+---------+-----------+----------+-------------------+ FV DistalFull                                                             +---------+---------------+---------+-----------+----------+-------------------+ PFV      Full                                                             +---------+---------------+---------+-----------+----------+-------------------+ POP      Full           Yes      Yes                                      +---------+---------------+---------+-----------+----------+-------------------+ PTV      Partial                                      Age Indeterminate   +---------+---------------+---------+-----------+----------+-------------------+ PERO  Not well visualized +---------+---------------+---------+-----------+----------+-------------------+     Summary: RIGHT: - No evidence of common femoral vein obstruction.  - Ultrasound characteristics of enlarged lymph nodes are noted in the groin.  LEFT: - Findings consistent with age indeterminate deep vein thrombosis involving a single left  posterior tibial vein.  - No cystic structure found in the popliteal fossa.  *See table(s) above for measurements and observations. Electronically signed by Sherald Hess MD on 09/22/2020 at 4:25:48 PM.    Final     Scheduled Meds: . apixaban  10 mg Oral BID   Followed by  . [START ON 09/30/2020] apixaban  5 mg Oral BID  . atorvastatin  40 mg Oral Daily  . brimonidine  1 drop Both Eyes q AM  . diltiazem  240 mg Oral q AM  . dorzolamide-timolol  1 drop Both Eyes q AM  . escitalopram  5 mg Oral q AM  . gabapentin  100 mg Oral q AM  . levothyroxine  75 mcg Oral QAC breakfast  . losartan  100 mg Oral q AM  . pantoprazole  40 mg Oral q AM  . sucralfate  1 g Oral BID  . Vitamin D (Ergocalciferol)  50,000 Units Oral Q7 days   Continuous Infusions:   LOS: 0 days    Time spent: 25 mins    Cipriano Bunker, MD [facility]   If 7PM-7AM, please contact night-coverage

## 2020-09-24 NOTE — Consult Note (Addendum)
[facility] Face-to-Face Psychiatry Consult   Reason for Consult: Evaluation Referring Physician:  Dr. Cipriano Bunker Patient Identification: Tammy Jones MRN:  [id number] Principal Diagnosis: Pulmonary embolus [facility]) Diagnosis:  Principal Problem:   Pulmonary embolus (HCC) Active Problems:   Occipital stroke (HCC)   Paroxysmal atrial fibrillation (HCC)   Delirium   Dementia with behavioral disturbance (HCC)   Pulmonary embolism (HCC)   Total Time spent with patient: 45 minutes  Subjective:   Tammy Jones is a 87 y.o. female patient admitted with suspected delirium.  Patient is seen and assessed, chart reviewed and discussed with Dr. Lucianne Muss.  Patient daughter is also at the bedside, verbal consent obtained to speak with daughter.  On evaluation patient is alert and oriented x4, she does require multiple prompts and clues as she is exhibiting difficulty word finding.  She was unable to correctly identify the president,  But could not name his predecessor.  She also identified the year as 2020, however was able to appropriately read the calendar on the wall and identified today's correct date as September 24, 2020.  She did appear to be exhibiting some visual disturbances, and she required rapid blinking of her eyes and refocusing of her vision in order to see on the wall.  Patient and daughter both deny, previous psychiatric history.  It appears patient is currently taking Lexapro and Neurontin, as a result of her previous stroke.  Patient is also been diagnosed with dementia prior to her last stroke.  Chart review indicates patient was exhibiting signs of dementia in 2020.  Patient and daughter, deny any safety concerns at this time.  Patient is able to elaborate on her visual hallucinations, that are described as " good fairies.  I see them all the time.  It is a set of twins, and a little boy.  Today I am just seeing a little boy, the twins are nowhere to be found.  I have not seen him without them  yet, and I cannot say how they are related."  Patient and daughter both discussed daily use alcohol, to include a glass of wine a day.  Patient has not had any alcohol since her fall on March 17.  She denies any previous suicide attempts, current suicidal thoughts or ideations.  She denies any current auditory hallucinations.  At present she does not appear to be responding to internal or external stimuli, exhibiting delusional thinking, and or psychosis or paranoia.  She appears to be responding and answering all questions appropriately as asked.  At times she does look to her daughter for guidance and assistance with answering questions and reassurance.  Support, encouragement, and reassurance were offered.  Patient appears to be psychiatrically stable, to discharge with outpatient resources.  Will recommend follow-up with neuropsychology for dementia and cognitive impairment evaluation.  HPI:  This 87 yrs old female with PMH significant for dementia, prior stroke, PAF not on anticoagulation anymore due to recurrent falls, hypertension.  Patient was living with her daughter three weeks ago, had sundowning at night and was asking repetitive questions, suffered mechanical fall and broke left tibia and fibula.  She was admitted in the [facility] and had open reduction internal fixation.  Hospital course was complicated by postoperative visual hallucinations and delirium.  Patient was subsequently stabilized and discharged to skilled nursing facility for rehab,  she was doing well until yesterday when patient's daughter went to see her mom,  she was very confused and has fluctuations in her mental status.  She  has brought in the ED,  work-up found out that she has small pulmonary emboli on CTA chest. CT head: showed old strokes.  Patient was admitted for delirium and pulmonary embolism, she was started on Eliquis.  MRI showed evolving stroke.  Neurology was consulted, Patient had MRI last month, showing the  same,  not a new stroke.  Patient is having delirium.  Past Psychiatric History:Denies  Risk to Self:  Denies Risk to Others:  Denies Prior Inpatient Therapy:  Denies Prior Outpatient Therapy:  Denies  Past Medical History:  Past Medical History:  Diagnosis Date  . Alzheimer's disease (HCC)   . Atrial fibrillation (HCC)   . 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 GERD (gastroesophageal reflux disease)   . Hyperlipidemia   . Hypertension     Past Surgical History:  Procedure Laterality Date  . ORIF TIBIA & FIBULA FRACTURES Left 2022   Family History:  Family History  Problem Relation Age of Onset  . Diabetes Sister   . Stroke Neg Hx    Family Psychiatric  History: Denies Social History:  Social History   Substance and Sexual Activity  Alcohol Use Never     Social History   Substance and Sexual Activity  Drug Use Never    Social History   Socioeconomic History  . Marital status: Single    Spouse name: Not on file  . Number of children: Not on file  . Years of education: Not on file  . Highest education level: Not on file  Occupational History  . Not on file  Tobacco Use  . Smoking status: Former Smoker    Packs/day: 1.00    Years: 10.00    Pack years: 10.00  . Smokeless tobacco: Not on file  Substance and Sexual Activity  . Alcohol use: Never  . Drug use: Never  . Sexual activity: Not on file  Other Topics Concern  . Not on file  Social History Narrative  . Not on file   Social Determinants of Health   Financial Resource Strain: Not on file  Food Insecurity: Not on file  Transportation Needs: Not on file  Physical Activity: Not on file  Stress: Not on file  Social Connections: Not on file   Additional Social History:    Allergies:   Allergies  Allergen Reactions  . Apixaban Diarrhea  . Hydrocodone Nausea And Vomiting  . Morphine Other (See Comments)    Hallucinations  . Penicillins Other (See  Comments)    "Allergic," per MAR  . Other Rash    LOBSTER    Labs:  Results for orders placed or performed during the hospital encounter of 09/22/20 (from the past 48 hour(s))  CBC with Differential     Status: Abnormal   Collection Time: 09/22/20  1:52 PM  Result Value Ref Range   WBC 6.8 4.0 - 10.5 K/uL   RBC 3.49 (L) 3.87 - 5.11 MIL/uL   Hemoglobin 9.3 (L) 12.0 - 15.0 g/dL   HCT 72.6 (L) 20.3 - 55.9 %   MCV 83.1 80.0 - 100.0 fL   MCH 26.6 26.0 - 34.0 pg   MCHC 32.1 30.0 - 36.0 g/dL   RDW 74.1 (H) 63.8 - 45.3 %   Platelets 345 150 - 400 K/uL   nRBC 0.0 0.0 - 0.2 %   Neutrophils Relative % 73 %   Neutro Abs 5.0 1.7 - 7.7 K/uL   Lymphocytes Relative 14 %  Lymphs Abs 0.9 0.7 - 4.0 K/uL   Monocytes Relative 12 %   Monocytes Absolute 0.8 0.1 - 1.0 K/uL   Eosinophils Relative 0 %   Eosinophils Absolute 0.0 0.0 - 0.5 K/uL   Basophils Relative 1 %   Basophils Absolute 0.0 0.0 - 0.1 K/uL   Immature Granulocytes 0 %   Abs Immature Granulocytes 0.02 0.00 - 0.07 K/uL    Comment: Performed at [facility], [address], [place], [place] [address]  D-dimer, quantitative     Status: Abnormal   Collection Time: 09/22/20  1:52 PM  Result Value Ref Range   D-Dimer, Quant 3.81 (H) 0.00 - 0.50 ug/mL-FEU    Comment: (NOTE) At the manufacturer cut-off value of 0.5 g/mL FEU, this assay has a negative predictive value of 95-100%.This assay is intended for use in conjunction with a clinical pretest probability (PTP) assessment model to exclude pulmonary embolism (PE) and deep venous thrombosis (DVT) in outpatients suspected of PE or DVT. Results should be correlated with clinical presentation. Performed at [facility], [address], [place], [place] [address]   Comprehensive metabolic panel     Status: Abnormal   Collection Time: 09/22/20  1:52 PM  Result Value Ref Range   Sodium 135 135 - 145 mmol/L   Potassium 3.6 3.5 - 5.1 mmol/L   Chloride 105 98 - 111 mmol/L    CO2 23 22 - 32 mmol/L   Glucose, Bld 125 (H) 70 - 99 mg/dL    Comment: Glucose reference range applies only to samples taken after fasting for at least 8 hours.   BUN 12 8 - 23 mg/dL   Creatinine, Ser 0.98 0.44 - 1.00 mg/dL   Calcium 8.1 (L) 8.9 - 10.3 mg/dL   Total Protein 6.1 (L) 6.5 - 8.1 g/dL   Albumin 2.1 (L) 3.5 - 5.0 g/dL   AST 32 15 - 41 U/L   ALT 30 0 - 44 U/L   Alkaline Phosphatase 97 38 - 126 U/L   Total Bilirubin 0.7 0.3 - 1.2 mg/dL   GFR, Estimated >11 >91 mL/min    Comment: (NOTE) Calculated using the CKD-EPI Creatinine Equation (2021)    Anion gap 7 5 - 15    Comment: Performed at [facility], [address] [place], [place] [address]  Troponin I (High Sensitivity)     Status: None   Collection Time: 09/22/20  1:52 PM  Result Value Ref Range   Troponin I (High Sensitivity) 9 <18 ng/L    Comment: (NOTE) Elevated high sensitivity troponin I (hsTnI) values and significant  changes across serial measurements may suggest ACS but many other  chronic and acute conditions are known to elevate hsTnI results.  Refer to the "Links" section for chest pain algorithms and additional  guidance. Performed at [facility], [address], [place], [place] [address]   Lactic acid, plasma     Status: None   Collection Time: 09/22/20  1:53 PM  Result Value Ref Range   Lactic Acid, Venous 0.9 0.5 - 1.9 mmol/L    Comment: Performed at [facility], [address], [place], [place] [address]  Troponin I (High Sensitivity)     Status: None   Collection Time: 09/22/20  3:53 PM  Result Value Ref Range   Troponin I (High Sensitivity) 9 <18 ng/L    Comment: (NOTE) Elevated high sensitivity troponin I (hsTnI) values and significant  changes across serial  measurements may suggest ACS but many other  chronic and acute conditions are known to elevate hsTnI results.  Refer to the "Links" section for chest pain algorithms and additional  guidance. Performed at [facility], [address] [place], [place] [address]   Urinalysis, Routine w reflex microscopic Urine, Clean Catch     Status: Abnormal   Collection Time: 09/22/20  7:37 PM  Result Value Ref Range   Color, Urine YELLOW YELLOW   APPearance CLEAR CLEAR   Specific Gravity, Urine 1.018 1.005 - 1.030   pH 6.0 5.0 - 8.0   Glucose, UA NEGATIVE NEGATIVE mg/dL   Hgb urine dipstick SMALL (A) NEGATIVE   Bilirubin Urine NEGATIVE NEGATIVE   Ketones, ur NEGATIVE NEGATIVE mg/dL   Protein, ur NEGATIVE NEGATIVE mg/dL   Nitrite NEGATIVE NEGATIVE   Leukocytes,Ua TRACE (A) NEGATIVE   RBC / HPF 0-5 0 - 5 RBC/hpf   WBC, UA 0-5 0 - 5 WBC/hpf   Bacteria, UA RARE (A) NONE SEEN   Squamous Epithelial / LPF 0-5 0 - 5    Comment: Performed at [facility], [address] [place], [place] [address]  Urine culture     Status: None (Preliminary result)   Collection Time: 09/22/20  8:56 PM   Specimen: Urine, Clean Catch  Result Value Ref Range   Specimen Description URINE, CLEAN CATCH    Special Requests NONE    Culture      CULTURE REINCUBATED FOR BETTER GROWTH Performed at [facility], [address] [place], [place] [address]    Report Status PENDING   SARS CORONAVIRUS 2 (TAT 6-24 HRS) Nasopharyngeal Nasopharyngeal Swab     Status: None   Collection Time: 09/23/20 12:18 AM   Specimen: Nasopharyngeal Swab  Result Value Ref Range   SARS Coronavirus 2 NEGATIVE NEGATIVE    Comment: (NOTE) SARS-CoV-2 target nucleic acids are NOT DETECTED.  The SARS-CoV-2 RNA is generally detectable in upper and lower respiratory specimens during the acute phase of infection. Negative results do not preclude SARS-CoV-2 infection, do not rule out co-infections with other pathogens, and should not be used as the sole basis for treatment or other patient management decisions. Negative results must be combined with clinical observations, patient history, and epidemiological information. The expected result  is Negative.  Fact Sheet for Patients: HairSlick.nohttps://www.fda.gov/media/138098/download  Fact Sheet for Healthcare Providers: quierodirigir.comhttps://www.fda.gov/media/138095/download  This test is not yet approved or cleared by the [place] FDA and  has been authorized for detection and/or diagnosis of SARS-CoV-2 by FDA under an Emergency Use Authorization (EUA). This EUA will remain  in effect (meaning this test can be used) for the duration of the COVID-19 declaration under Se ction 564(b)(1) of the Act, 21 U.S.C. section 360bbb-3(b)(1), unless the authorization is terminated or revoked sooner.  Performed at [facility], [address] [place], [place] [address]   CBC     Status: Abnormal   Collection Time: 09/23/20  4:10 AM  Result Value Ref Range   WBC 6.2 4.0 - 10.5 K/uL   RBC 3.34 (L) 3.87 - 5.11 MIL/uL   Hemoglobin 8.9 (L) 12.0 - 15.0 g/dL   HCT 30.827.9 (L) 65.736.0 - 84.646.0 %   MCV 83.5 80.0 - 100.0 fL   MCH 26.6 26.0 - 34.0 pg   MCHC 31.9 30.0 - 36.0 g/dL   RDW 96.215.9 (H) 95.211.5 - 84.115.5 %   Platelets 321 150 - 400 K/uL   nRBC  0.0 0.0 - 0.2 %    Comment: Performed at [facility], [address] [place], [place] [address]  Basic metabolic panel     Status: Abnormal   Collection Time: 09/23/20  4:10 AM  Result Value Ref Range   Sodium 135 135 - 145 mmol/L   Potassium 3.5 3.5 - 5.1 mmol/L   Chloride 105 98 - 111 mmol/L   CO2 23 22 - 32 mmol/L   Glucose, Bld 110 (H) 70 - 99 mg/dL    Comment: Glucose reference range applies only to samples taken after fasting for at least 8 hours.   BUN 11 8 - 23 mg/dL   Creatinine, Ser 0.73 0.44 - 1.00 mg/dL   Calcium 8.3 (L) 8.9 - 10.3 mg/dL   GFR, Estimated >71 >06 mL/min    Comment: (NOTE) Calculated using the CKD-EPI Creatinine Equation (2021)    Anion gap 7 5 - 15    Comment: Performed at [facility], [address], [place], [place] [address]  CBC     Status: Abnormal   Collection Time: 09/24/20  3:40 AM  Result Value Ref Range    WBC 5.2 4.0 - 10.5 K/uL   RBC 3.19 (L) 3.87 - 5.11 MIL/uL   Hemoglobin 8.5 (L) 12.0 - 15.0 g/dL   HCT 54.6 (L) 27.0 - 35.0 %   MCV 83.7 80.0 - 100.0 fL   MCH 26.6 26.0 - 34.0 pg   MCHC 31.8 30.0 - 36.0 g/dL   RDW 09.3 (H) 81.8 - 29.9 %   Platelets 368 150 - 400 K/uL   nRBC 0.0 0.0 - 0.2 %    Comment: Performed at [facility], [address] [place], [place] [address]  Basic metabolic panel     Status: Abnormal   Collection Time: 09/24/20  3:40 AM  Result Value Ref Range   Sodium 134 (L) 135 - 145 mmol/L   Potassium 3.8 3.5 - 5.1 mmol/L   Chloride 104 98 - 111 mmol/L   CO2 23 22 - 32 mmol/L   Glucose, Bld 96 70 - 99 mg/dL    Comment: Glucose reference range applies only to samples taken after fasting for at least 8 hours.   BUN 19 8 - 23 mg/dL   Creatinine, Ser 6.78 (H) 0.44 - 1.00 mg/dL   Calcium 8.1 (L) 8.9 - 10.3 mg/dL   GFR, Estimated 50 (L) >60 mL/min    Comment: (NOTE) Calculated using the CKD-EPI Creatinine Equation (2021)    Anion gap 7 5 - 15    Comment: Performed at [facility], [address] [place], [place] [address]  Magnesium     Status: None   Collection Time: 09/24/20  3:40 AM  Result Value Ref Range   Magnesium 2.2 1.7 - 2.4 mg/dL    Comment: Performed at [facility], [address] [place], [place] [address]  Phosphorus     Status: None   Collection Time: 09/24/20  3:40 AM  Result Value Ref Range   Phosphorus 3.9 2.5 - 4.6 mg/dL    Comment: Performed at [facility], [address], [place], [place] [address]    Current Facility-Administered Medications  Medication Dose Route Frequency Provider Last Rate Last Admin  . acetaminophen (TYLENOL) tablet 650 mg  650 mg Oral Q6H PRN Hillary Bow, DO   650 mg at 09/24/20 0810   Or  . acetaminophen (TYLENOL) suppository 650 mg  650 mg Rectal Q6H PRN Hillary Bow, DO      . apixaban Everlene Balls) tablet 10 mg  10 mg Oral BID Koleen Distance, MD   10 mg at 09/24/20 4098    Followed by  . [START ON 09/30/2020] apixaban (ELIQUIS) tablet 5 mg  5 mg Oral BID Koleen Distance, MD      . atorvastatin (LIPITOR) tablet 40 mg  40 mg Oral Daily Koleen Distance, MD   40 mg at 09/24/20 1191  . brimonidine (ALPHAGAN) 0.2 % ophthalmic solution 1 drop  1 drop Both Eyes q AM Koleen Distance, MD   1 drop at 09/24/20 [phone]  . diltiazem (CARDIZEM CD) 24 hr capsule 240 mg  240 mg Oral q AM Koleen Distance, MD   240 mg at 09/24/20 0811  . dorzolamide-timolol (COSOPT) 22.3-6.8 MG/ML ophthalmic solution 1 drop  1 drop Both Eyes q AM Koleen Distance, MD   1 drop at 09/24/20 [phone]  . escitalopram (LEXAPRO) tablet 5 mg  5 mg Oral q AM Koleen Distance, MD   5 mg at 09/24/20 0810  . gabapentin (NEURONTIN) capsule 100 mg  100 mg Oral q AM Koleen Distance, MD   100 mg at 09/24/20 1308  . levothyroxine (SYNTHROID) tablet 75 mcg  75 mcg Oral QAC breakfast Koleen Distance, MD   75 mcg at 09/24/20 0810  . LORazepam (ATIVAN) injection 1 mg  1 mg Intravenous Once PRN Hillary Bow, DO      . losartan (COZAAR) tablet 100 mg  100 mg Oral q AM Koleen Distance, MD   100 mg at 09/24/20 6578  . ondansetron (ZOFRAN) tablet 4 mg  4 mg Oral Q6H PRN Hillary Bow, DO       Or  . ondansetron [facility]) injection 4 mg  4 mg Intravenous Q6H PRN Hillary Bow, DO      . pantoprazole (PROTONIX) EC tablet 40 mg  40 mg Oral q AM Koleen Distance, MD   40 mg at 09/24/20 0810  . sucralfate (CARAFATE) 1 GM/10ML suspension 1 g  1 g Oral BID Koleen Distance, MD   1 g at 09/24/20 [phone]  . Vitamin D (Ergocalciferol) (DRISDOL) capsule 50,000 Units  50,000 Units Oral Q7 days Cipriano Bunker, MD   50,000 Units at 09/24/20 1021    Musculoskeletal: Strength & Muscle Tone: Unable to assess Gait & Station: Unable to assess Patient leans: N/A            Psychiatric Specialty Exam:  Presentation  General Appearance: Appropriate for Environment; Casual; Well Groomed  Eye Contact:Fleeting  Speech:Clear and Coherent; Normal  Rate  Speech Volume:Normal  Handedness:Right   Mood and Affect  Mood:Euthymic  Affect:Appropriate; Congruent   Thought Process  Thought Processes:Coherent; Goal Directed  Descriptions of Associations:Intact  Orientation:Full (Time, Place and Person)  Thought Content:WDL  History of Schizophrenia/Schizoaffective disorder:No  Duration of Psychotic Symptoms:No data recorded Hallucinations:Hallucinations: Visual Description of Visual Hallucinations: good fairies  Ideas of Reference:None  Suicidal Thoughts:Suicidal Thoughts: No  Homicidal Thoughts:Homicidal Thoughts: No   Sensorium  Memory:Immediate Fair; Remote Poor; Recent Fair  Judgment:Fair  Insight:Present   Executive Functions  Concentration:Fair  Attention Span:Fair  Recall:Poor  Fund of Knowledge:Poor  Language:Fair   Psychomotor Activity  Psychomotor Activity:Psychomotor Activity: Normal   Assets  Assets:Communication Skills; Housing; Social Support; Physical Health; Financial Resources/Insurance; Desire for Improvement   Sleep  Sleep:Sleep: Poor   Physical Exam: Physical  Exam ROS Blood pressure (!) 148/80, pulse 65, temperature 98.7 F (37.1 C), temperature source Oral, resp. rate 17, SpO2 98 %. There is no height or weight on file to calculate BMI.  Treatment Plan Summary: Plan We will start low-dose Seroquel 25 mg p.o. nightly for pulse CVA psychosis and prevention of delirium.  Recommend initiation of delirium precautions, as patient is at high risk.  Recommend patient follow-up with outpatient neuropsychology for ongoing evaluation cognitive impairment and dementia.  May benefit from neurocognitive intensive rehab. Last EKG obtained was QTc482, 09/23/2020.   Disposition: No evidence of imminent risk to self or others at present.   Patient does not meet criteria for psychiatric inpatient admission. Supportive therapy provided about ongoing stressors. Discussed crisis plan,  support from social network, calling 911, coming to the Emergency Department, and calling Suicide Hotline.  Maryagnes Amos, FNP 09/24/2020 1:43 PM

## 2020-09-25 LAB — BASIC METABOLIC PANEL
Anion gap: 7 (ref 5–15)
BUN: 18 mg/dL (ref 8–23)
CO2: 24 mmol/L (ref 22–32)
Calcium: 8 mg/dL — ABNORMAL LOW (ref 8.9–10.3)
Chloride: 106 mmol/L (ref 98–111)
Creatinine, Ser: 1.03 mg/dL — ABNORMAL HIGH (ref 0.44–1.00)
GFR, Estimated: 53 mL/min — ABNORMAL LOW (ref >60)
Glucose, Bld: 110 mg/dL — ABNORMAL HIGH (ref 70–99)
Potassium: 3.6 mmol/L (ref 3.5–5.1)
Sodium: 137 mmol/L (ref 135–145)

## 2020-09-25 LAB — URINE CULTURE: Culture: >=100000 — AB

## 2020-09-25 LAB — HEMOGLOBIN AND HEMATOCRIT, BLOOD
HCT: 27 % — ABNORMAL LOW (ref 36.0–46.0)
Hemoglobin: 8.6 g/dL — ABNORMAL LOW (ref 12.0–15.0)

## 2020-09-25 MED ORDER — ZINC OXIDE 12.8 % EX OINT
TOPICAL_OINTMENT | Freq: Four times a day (QID) | CUTANEOUS | Status: DC
  Administered 2020-09-26 – 2020-09-27 (×6): 1 via TOPICAL
  Filled 2020-09-25: qty 56.7

## 2020-09-25 NOTE — Evaluation (Signed)
Occupational Therapy Evaluation Patient Details Name: Tammy Jones MRN: [id number] DOB: 06-15-1932 Today's Date: 09/25/2020    History of Present Illness This 87 yrs old female with PMH significant for dementia, prior stroke, PAF not on anticoagulation anymore due to recurrent falls, hypertension.  Patient was living with her daughter three weeks ago, had sundowning at night and was asking repetitive questions, suffered mechanical fall and broke left tibia and fibula.  She was admitted in the [facility] and had open reduction internal fixation.  Hospital course was complicated by postoperative visual hallucinations and delirium.  Patient was subsequently stabilized and discharged to skilled nursing facility for rehab,  she was doing well until yesterday when patient's daughter went to see her mom,  she was very confused and has fluctuations in her mental status.  She has brought in the ED,  work-up found out that she has small pulmonary emboli on CTA chest. CT head: showed old strokes.  Patient was admitted for delirium and pulmonary embolism, she was started on Eliquis.  MRI showed evolving stroke.  Neurology was consulted, Patient had MRI last month, showing the same,  not a new stroke.   Clinical Impression   Tammy Jones is an 87 year old woman who presents with generalized weakness, decreased activity tolerance, impaired balance, impaired vision in both eyes, NWB status in LLE from recent tib/fib fracture and impaired cognition resulting in a significant decline in functional abilities.Today patient requiring max assist to transfer into sitting, exhibits grossly functional sitting balance but unable to tolerate upright position for longer than 5 minutes due to complaints of back pain. Patient requiring bed level assistance for ADLs due to limited mobility, poor activity tolerance and visual deficits. Patient will benefit from skilled OT services while in hospital to improve deficits  and learn compensatory strategies as needed in order to improve functional abilities and reduce caregiver burden. Recommend return to rehab to complete therapy.      Follow Up Recommendations  SNF    Equipment Recommendations  None recommended by OT    Recommendations for Other Services       Precautions / Restrictions Precautions Precautions: Fall Required Braces or Orthoses: Splint/Cast Splint/Cast: L ankle Restrictions Weight Bearing Restrictions: Yes LLE Weight Bearing: Non weight bearing      Mobility Bed Mobility Overal bed mobility: Needs Assistance Bed Mobility: Supine to Sit;Sit to Supine     Supine to sit: Max assist Sit to supine: Mod assist   General bed mobility comments: Max assistance for LEs and trunk negotiation to transfer to side of bed. Patient exhibiting limited tolerance, less than 5 minutes, complaining of pain in her back and wanting to low down. Mod assist to return to supine.    Transfers                      Balance Overall balance assessment: Needs assistance Sitting-balance support: No upper extremity supported Sitting balance-Leahy Scale: Fair Sitting balance - Comments: very close min guard to sit edge of bed                                   ADL either performed or assessed with clinical judgement   ADL Overall ADL's : Needs assistance/impaired Eating/Feeding: Set up;Supervision/ safety;Bed level   Grooming: Set up;Supervision/safety;Bed level   Upper Body Bathing: Set up;Supervision/ safety;Bed level   Lower Body Bathing: Maximal assistance;Bed level  Upper Body Dressing : Moderate assistance;Bed level   Lower Body Dressing: Total assistance;Bed level       Toileting- Clothing Manipulation and Hygiene: Total assistance;Bed level               Vision Patient Visual Report: Blurring of vision;Peripheral vision impairment Vision Assessment?: Vision impaired- to be further tested in functional  context;Yes Tracking/Visual Pursuits: Right eye does not track laterally;Left eye does not track laterally Visual Fields: Left visual field deficit;Right visual field deficit Additional Comments: Left eye blurrier than right per patient report. Difficulty visually tracking in both eyes. Appears to have peripheral vision loss in left and right. Does best with seeing items at midline and near one foot in front of face. Hx of stroke in 2020 which left her with right hemianopsia and then again in 2022 March which left her with left hemianopsia     Perception     Praxis      Pertinent Vitals/Pain Pain Assessment: Faces Faces Pain Scale: Hurts little more Pain Location: LLE Pain Descriptors / Indicators: Guarding;Grimacing Pain Intervention(s): Limited activity within patient's tolerance;Monitored during session     Hand Dominance     Extremity/Trunk Assessment Upper Extremity Assessment Upper Extremity Assessment: RUE deficits/detail;LUE deficits/detail RUE Deficits / Details: WFL ROM; 4-/5 shoulder, 4/5 elbow, 5/5 wrist, grip 4-/5 RUE Sensation: WNL RUE Coordination: WNL LUE Deficits / Details: WFL ROM; 4-/5 shoulder, 4-/5 elbow, 4/5 wrist, grip 4-/5; left upper arm and posterior forearm appears somewhat edematous LUE Sensation: WNL LUE Coordination: WNL   Lower Extremity Assessment Lower Extremity Assessment: Defer to PT evaluation   Cervical / Trunk Assessment Cervical / Trunk Assessment: Kyphotic   Communication Communication Communication: No difficulties   Cognition Arousal/Alertness: Awake/alert Behavior During Therapy: WFL for tasks assessed/performed Overall Cognitive Status: No family/caregiver present to determine baseline cognitive functioning                                 General Comments: Alert to self. Is unable to state hospital name, current city or town she lives in. States the wrong year. Is able to state Biden when asked who the President is.  Is somewhat able to report recent medical history and being at rehab. Details limited.   General Comments       Exercises     Shoulder Instructions      Home Living Family/patient expects to be discharged to:: Skilled nursing facility                                        Prior Functioning/Environment Level of Independence: Needs assistance        Comments: Not a reliable historian.        OT Problem List: Decreased strength;Decreased activity tolerance;Impaired balance (sitting and/or standing);Impaired vision/perception;Decreased cognition;Decreased knowledge of use of DME or AE;Pain;Obesity      OT Treatment/Interventions: Self-care/ADL training;Therapeutic exercise;Neuromuscular education;Therapeutic activities;Balance training;Patient/family education;Cognitive remediation/compensation    OT Goals(Current goals can be found in the care plan section) Acute Rehab OT Goals OT Goal Formulation: Patient unable to participate in goal setting Time For Goal Achievement: 10/09/20 Potential to Achieve Goals: Good  OT Frequency: Min 2X/week   Barriers to D/C:            Co-evaluation  AM-PAC OT "6 Clicks" Daily Activity     Outcome Measure Help from another person eating meals?: A Little Help from another person taking care of personal grooming?: A Little Help from another person toileting, which includes using toliet, bedpan, or urinal?: Total Help from another person bathing (including washing, rinsing, drying)?: A Lot Help from another person to put on and taking off regular upper body clothing?: A Lot Help from another person to put on and taking off regular lower body clothing?: Total 6 Click Score: 12   End of Session    Activity Tolerance: Patient limited by fatigue;Patient limited by pain Patient left: in bed;with call bell/phone within reach;with bed alarm set  OT Visit Diagnosis: Other abnormalities of gait and mobility  (R26.89);History of falling (Z91.81);Pain;Low vision, both eyes (H54.2);Other symptoms and signs involving cognitive function                Time: 1511-1531 OT Time Calculation (min): 20 min Charges:  OT General Charges $OT Visit: 1 Visit OT Evaluation $OT Eval Moderate Complexity: 1 Mod  Suellen Durocher, OTR/L Acute Care Rehab Services  Office [phone] Pager: [phone]   Kelli Churn 09/25/2020, 3:49 PM

## 2020-09-25 NOTE — Consult Note (Signed)
WOC Nurse Consult Note: Patient receiving care in [facility] 2W01. Primary LPN in room at time of my assessment Reason for Consult: "back wound" Wound type: NO wound found on the patient's back. Helmut Muster, RN, MSN, CWOCN, CNS-BC, pager [phone]

## 2020-09-25 NOTE — Progress Notes (Signed)
PROGRESS NOTE    Tammy Jones  HQI:[id number] DOB: Nov 26, 1931 DOA: 09/22/2020 PCP: Everlean Cherry, MD   Brief Narrative:  This 87 yrs old female with PMH significant for dementia, prior stroke, PAF not on anticoagulation anymore due to recurrent falls, hypertension.  Patient was living with her daughter three weeks ago, had sundowning at night and was asking repetitive questions, suffered mechanical fall and broke left tibia and fibula.  She was admitted in the [facility] and had open reduction internal fixation.  Hospital course was complicated by postoperative visual hallucinations and delirium.  Patient was subsequently stabilized and discharged to skilled nursing facility for rehab,  she was doing well until yesterday when patient's daughter went to see her mom,  she was very confused and has fluctuations in her mental status.  She has brought in the ED,  work-up found out that she has small pulmonary emboli on CTA chest. CT head: showed old strokes.  Patient was admitted for delirium and pulmonary embolism, she was started on Eliquis.  MRI showed evolving stroke.  Neurology was consulted, Patient had MRI last month, showing the same,  not a new stroke.  Patient is having delirium.  Assessment & Plan:   Principal Problem:   Pulmonary embolus (HCC) Active Problems:   Occipital stroke (HCC)   Paroxysmal atrial fibrillation (HCC)   Delirium   Dementia with behavioral disturbance (HCC)   Pulmonary embolism (HCC)   Delirium on pre-existing dementia: Patient was found to have fluctuations in her mental status at nursing home. Work-up in the ED,  found to have a small PE. CT head showed evolving stroke,  MRI showed the same. Neurology consulted,  Patient had MRI last month showing the same findings, there is no new stroke,  Patient is having delirium. Neurochecks every 6 hours. Frequent re-orientation Mental status changes may be due to patient being in new unfamiliar environment  and pre-existing dementia. Psych consulted, Patient reports intermittent hallucinations. Patient does have history of occipital stroke. Patient is started on Eliquis instead for PE, aspirin and Plavix kept on hold. New stroke ruled out, MRI showed chronic left PCA and bilateral cerebellar territory infarcts. Patient was started on low-dose Seroquel 25 mg at night for psychosis and prevention of delirium. Patient was seen by psychiatry.  No evidence of imminent risk to self or others, does not meet inpatient psych admission criteria. Neurology recommended outpatient ophthalmological evaluation and outpatient neurology follow-up in 4 to 6 weeks. PT/ OT evaluation.  Pulmonary embolism: Patient is found to have small volume PE, from posterior tibial vein DVT seen on ultrasound. Continue telemetry. 2D Echo: Abnormal septal motion ? from LBBB. LVEF is 50 to 55%. The left ventricle has low normal  function. The left ventricle has no regional wall motion abnormalities.  There is mild left ventricular hypertrophy We will continue Eliquis. Monitor H&H.  Daughter reports history of black stools in the past. Patient is also at high risk for falls. Long term A/C,  needs to be addressed given risks of mechanical falls.  Hypertension : Continue Losartan. BP controlled.  Paroxysmal A. fib.   Heart rate is controlled. She was not on any anticoagulation given risk of falls. But now patient started on Eliquis given PE and DVT. Continue Cardizem CD 240 mg daily  Left ankle fracture: Continue supportive care. Adequate pain control. Consider ortho consult.  Hypothyroidism: Continue levothyroxine.  Skin tear on buttocks: Wound care consulted,  Continue triple paste 4 times.  DVT prophylaxis: Eliquis Code  Status: DNR Family Communication: Daughter at bed side. Disposition Plan:   Status is: Inpatient  Remains inpatient appropriate because:Inpatient level of care appropriate due to severity of  illness   Dispo: The patient is from:SNF              Anticipated d/c is to: SNF              Patient currently is not medically stable to d/c.   Difficult to place patient No   Consultants:   Neurology    Procedures: Echocardiogram Antimicrobials:   Anti-infectives (From admission, onward)   None      Subjective: Patient was seen and examined at bedside.  Overnight events noted.   RN reports hallucinations last night. Patient is alert, awake, reports feeling better,  Patient denies any suicidal / homicidal ideations.  Patient was having breakfast. RN reports left foot appears swollen.  Patient was started on Seroquel.  Objective: Vitals:   09/24/20 0517 09/24/20 0811 09/24/20 2121 09/25/20 0849  BP: (!) 150/65 (!) 148/80 (!) 157/96 (!) 161/80  Pulse: 65 65 69 72  Resp: 18 17 20 16   Temp: 98 F (36.7 C) 98.7 F (37.1 C) 98.3 F (36.8 C) 97.9 F (36.6 C)  TempSrc: Oral Oral Oral Oral  SpO2: 95% 98% 100% 98%   No intake or output data in the 24 hours ending 09/25/20 1120 There were no vitals filed for this visit.  Examination:  General exam: Appears calm and comfortable, not in any acute distress. Respiratory system: Clear to auscultation. Respiratory effort normal. Cardiovascular system: S1 & S2 heard, RRR. No JVD, murmurs, rubs, gallops or clicks. No pedal edema. Gastrointestinal system: Abdomen is nondistended, soft and nontender. No organomegaly or masses felt.  Normal bowel sounds heard. Central nervous system: Alert and oriented. No focal neurological deficits. Extremities:  No edema, no cyanosis, no clubbing. Left foot in caste, appear swollen. Skin: No rashes, lesions or ulcers Psychiatry: Judgement and insight appear normal. Mood & affect appropriate.     Data Reviewed: I have personally reviewed following labs and imaging studies  CBC: Recent Labs  Lab 09/22/20 1352 09/23/20 0410 09/24/20 0340 09/24/20 1817 09/25/20 0234  WBC 6.8 6.2 5.2   --   --   NEUTROABS 5.0  --   --   --   --   HGB 9.3* 8.9* 8.5* 9.1* 8.6*  HCT 29.0* 27.9* 26.7* 29.6* 27.0*  MCV 83.1 83.5 83.7  --   --   PLT 345 321 368  --   --    Basic Metabolic Panel: Recent Labs  Lab 09/22/20 1352 09/23/20 0410 09/24/20 0340 09/25/20 0234  NA 135 135 134* 137  K 3.6 3.5 3.8 3.6  CL 105 105 104 106  CO2 23 23 23 24   GLUCOSE 125* 110* 96 110*  BUN 12 11 19 18   CREATININE 0.88 0.88 1.08* 1.03*  CALCIUM 8.1* 8.3* 8.1* 8.0*  MG  --   --  2.2  --   PHOS  --   --  3.9  --    GFR: CrCl cannot be calculated (Unknown ideal weight.). Liver Function Tests: Recent Labs  Lab 09/22/20 1352  AST 32  ALT 30  ALKPHOS 97  BILITOT 0.7  PROT 6.1*  ALBUMIN 2.1*   No results for input(s): LIPASE, AMYLASE in the last 168 hours. No results for input(s): AMMONIA in the last 168 hours. Coagulation Profile: No results for input(s): INR, PROTIME in the last 168 hours.  Cardiac Enzymes: No results for input(s): CKTOTAL, CKMB, CKMBINDEX, TROPONINI in the last 168 hours. BNP (last 3 results) No results for input(s): PROBNP in the last 8760 hours. HbA1C: No results for input(s): HGBA1C in the last 72 hours. CBG: Recent Labs  Lab 09/22/20 1251  GLUCAP 125*   Lipid Profile: No results for input(s): CHOL, HDL, LDLCALC, TRIG, CHOLHDL, LDLDIRECT in the last 72 hours. Thyroid Function Tests: No results for input(s): TSH, T4TOTAL, FREET4, T3FREE, THYROIDAB in the last 72 hours. Anemia Panel: No results for input(s): VITAMINB12, FOLATE, FERRITIN, TIBC, IRON, RETICCTPCT in the last 72 hours. Sepsis Labs: Recent Labs  Lab 09/22/20 1353  LATICACIDVEN 0.9    Recent Results (from the past 240 hour(s))  Urine culture     Status: Abnormal   Collection Time: 09/22/20  8:56 PM   Specimen: Urine, Clean Catch  Result Value Ref Range Status   Specimen Description URINE, CLEAN CATCH  Final   Special Requests NONE  Final   Culture (A)  Final    >=100,000 COLONIES/mL  GLOBICATELLA SANGUINIS Standardized susceptibility testing for this organism is not available. Performed at [facility], [address] [place], [place] [address]    Report Status 09/25/2020 FINAL  Final  SARS CORONAVIRUS 2 (TAT 6-24 HRS) Nasopharyngeal Nasopharyngeal Swab     Status: None   Collection Time: 09/23/20 12:18 AM   Specimen: Nasopharyngeal Swab  Result Value Ref Range Status   SARS Coronavirus 2 NEGATIVE NEGATIVE Final    Comment: (NOTE) SARS-CoV-2 target nucleic acids are NOT DETECTED.  The SARS-CoV-2 RNA is generally detectable in upper and lower respiratory specimens during the acute phase of infection. Negative results do not preclude SARS-CoV-2 infection, do not rule out co-infections with other pathogens, and should not be used as the sole basis for treatment or other patient management decisions. Negative results must be combined with clinical observations, patient history, and epidemiological information. The expected result is Negative.  Fact Sheet for Patients: HairSlick.nohttps://www.fda.gov/media/138098/download  Fact Sheet for Healthcare Providers: quierodirigir.comhttps://www.fda.gov/media/138095/download  This test is not yet approved or cleared by the [place] FDA and  has been authorized for detection and/or diagnosis of SARS-CoV-2 by FDA under an Emergency Use Authorization (EUA). This EUA will remain  in effect (meaning this test can be used) for the duration of the COVID-19 declaration under Se ction 564(b)(1) of the Act, 21 U.S.C. section 360bbb-3(b)(1), unless the authorization is terminated or revoked sooner.  Performed at [facility], [address] [place], [place] [address]     Radiology Studies: CT ABDOMEN PELVIS WO CONTRAST  Result Date: 09/24/2020 CLINICAL DATA:  87 year old female with bloody stool. EXAM: CT ABDOMEN AND PELVIS WITHOUT CONTRAST TECHNIQUE: Multidetector CT imaging of the abdomen and pelvis was performed following the standard  protocol without IV contrast. COMPARISON:  None. FINDINGS: Evaluation of this exam is limited in the absence of intravenous contrast. Lower chest: The visualized lung bases are clear. There is calcification of the mitral annulus. No intra-abdominal free air or free fluid. Hepatobiliary: The liver is unremarkable. No intrahepatic biliary dilatation. Small gallstone. No pericholecystic fluid or evidence of acute cholecystitis by CT. Pancreas: Unremarkable. No pancreatic ductal dilatation or surrounding inflammatory changes. Spleen: Normal in size without focal abnormality. Adrenals/Urinary Tract: The left adrenal glands unremarkable. There is a 15 mm right adrenal adenoma. Left renal upper pole parenchyma atrophy. A 2.5 cm right renal upper pole hypodense lesion which is suboptimally characterized on this noncontrast CT, likely a  cyst. There is no hydronephrosis or nephrolithiasis on either side. The visualized ureters and urinary bladder appear unremarkable. Stomach/Bowel: There is moderate stool throughout the colon. There is sigmoid diverticulosis without active inflammatory changes. There is no bowel obstruction. The appendix is not visualized with certainty. No inflammatory changes identified in the right lower quadrant. Vascular/Lymphatic: Advanced aortoiliac atherosclerotic disease. The IVC is unremarkable. No portal venous gas. There is no adenopathy. Reproductive: Hysterectomy.  No adnexal masses. Other: None Musculoskeletal: Osteopenia with degenerative changes of the spine. There is a total right hip arthroplasty. No acute osseous pathology. IMPRESSION: 1. No acute intra-abdominal or pelvic pathology. 2. Sigmoid diverticulosis. No bowel obstruction. 3. Cholelithiasis. 4. Right adrenal adenoma. 5. Aortic Atherosclerosis (ICD10-I70.0). Electronically Signed   By: Elgie Collard M.D.   On: 09/24/2020 19:36    Scheduled Meds: . apixaban  10 mg Oral BID   Followed by  . [START ON 09/30/2020] apixaban  5  mg Oral BID  . atorvastatin  40 mg Oral Daily  . brimonidine  1 drop Both Eyes q AM  . diltiazem  240 mg Oral q AM  . dorzolamide-timolol  1 drop Both Eyes q AM  . escitalopram  5 mg Oral q AM  . gabapentin  100 mg Oral q AM  . levothyroxine  75 mcg Oral QAC breakfast  . losartan  100 mg Oral q AM  . pantoprazole  40 mg Oral q AM  . QUEtiapine  25 mg Oral QHS  . senna  1 tablet Oral BID  . sucralfate  1 g Oral BID  . Vitamin D (Ergocalciferol)  50,000 Units Oral Q7 days  . Zinc Oxide   Topical QID   Continuous Infusions:   LOS: 1 day    Time spent: 25 mins    Savilla Turbyfill, MD [facility]   If 7PM-7AM, please contact night-coverage

## 2020-09-25 NOTE — Plan of Care (Signed)

## 2020-09-25 NOTE — Evaluation (Signed)
Physical Therapy Evaluation Patient Details Name: Tammy Jones MRN: [id number] DOB: [birth year] Today's Date: 09/25/2020   History of Present Illness  This 87 yrs old female with PMH significant for dementia, prior stroke, PAF not on anticoagulation anymore due to recurrent falls, hypertension.  Patient was living with her daughter three weeks ago, had sundowning at night and was asking repetitive questions, suffered mechanical fall and broke left tibia and fibula.  She was admitted in the [facility] and had open reduction internal fixation.  Hospital course was complicated by postoperative visual hallucinations and delirium.  Patient was subsequently stabilized and discharged to skilled nursing facility for rehab,  she was doing well until yesterday when patient's daughter went to see her mom,  she was very confused and has fluctuations in her mental status.  She has brought in the ED,  work-up found out that she has small pulmonary emboli on CTA chest. CT head: showed old strokes.  Patient was admitted for delirium and pulmonary embolism, she was started on Eliquis.  MRI showed evolving stroke.  Neurology was consulted, Patient had MRI last month, showing the same,  not a new stroke.  Clinical Impression  Pt was seen for mobility on side of bed and worked on scooting up bed with mod max assist.  Pt is unable to manage the task without the bed pad to assist her, but is also struggling to remember LLE being NWB when exerting.  Follow acutely for goals of PT to strengthen and to work on trying to use RLE only to try to stand.      Follow Up Recommendations SNF    Equipment Recommendations  None recommended by PT    Recommendations for Other Services       Precautions / Restrictions Precautions Precautions: Fall Precaution Comments: has previous injury to L ankle and foot, NWB and was in rehab center for this Required Braces or Orthoses: Splint/Cast Splint/Cast: L  ankle Restrictions Weight Bearing Restrictions: Yes LLE Weight Bearing: Non weight bearing      Mobility  Bed Mobility Overal bed mobility: Needs Assistance Bed Mobility: Supine to Sit;Sit to Supine     Supine to sit: Mod assist Sit to supine: Mod assist        Transfers Overall transfer level: Needs assistance Equipment used: 1 person hand held assist Transfers: Lateral/Scoot Transfers          Lateral/Scoot Transfers: Mod assist General transfer comment: pt is unable to fully stand but mod assist with bed pad used to slide up bed  Ambulation/Gait                Stairs            Wheelchair Mobility    Modified Rankin (Stroke Patients Only)       Balance Overall balance assessment: Needs assistance Sitting-balance support: Feet supported Sitting balance-Leahy Scale: Fair Sitting balance - Comments: supervised EOB                                     Pertinent Vitals/Pain Pain Assessment: Faces Faces Pain Scale: Hurts little more Pain Location: LLE Pain Descriptors / Indicators: Guarding;Grimacing Pain Intervention(s): Limited activity within patient's tolerance;Monitored during session;Repositioned    Home Living Family/patient expects to be discharged to:: Skilled nursing facility  Prior Function Level of Independence: Needs assistance         Comments: daughter is providing history     Hand Dominance        Extremity/Trunk Assessment   Upper Extremity Assessment Upper Extremity Assessment: Overall WFL for tasks assessed    Lower Extremity Assessment Lower Extremity Assessment: LLE deficits/detail LLE Deficits / Details: splint on L ankle and leg LLE Coordination: decreased gross motor    Cervical / Trunk Assessment Cervical / Trunk Assessment: Kyphotic  Communication   Communication: No difficulties  Cognition Arousal/Alertness: Awake/alert Behavior During Therapy: WFL  for tasks assessed/performed Overall Cognitive Status: Impaired/Different from baseline Area of Impairment: Awareness;Safety/judgement;Following commands;Problem solving                       Following Commands: Follows one step commands inconsistently;Follows one step commands with increased time Safety/Judgement: Decreased awareness of safety;Decreased awareness of deficits Awareness: Intellectual Problem Solving: Slow processing;Requires verbal cues;Requires tactile cues General Comments: limited recall of information and struggling to follow limits for LLE      General Comments General comments (skin integrity, edema, etc.): pt is able to assist with scooting but cannot control RLE to stand on one leg    Exercises     Assessment/Plan    PT Assessment Patient needs continued PT services  PT Problem List Decreased strength;Decreased range of motion;Decreased activity tolerance;Decreased balance;Decreased mobility;Decreased coordination;Decreased cognition;Decreased knowledge of use of DME;Decreased safety awareness;Pain;Decreased skin integrity       PT Treatment Interventions DME instruction;Functional mobility training;Therapeutic activities;Therapeutic exercise;Balance training;Neuromuscular re-education;Patient/family education    PT Goals (Current goals can be found in the Care Plan section)  Acute Rehab PT Goals Patient Stated Goal: to walk again PT Goal Formulation: With patient/family Time For Goal Achievement: 10/09/20 Potential to Achieve Goals: Good    Frequency Min 3X/week   Barriers to discharge Decreased caregiver support;Inaccessible home environment NWB on LLE    Co-evaluation               AM-PAC PT "6 Clicks" Mobility  Outcome Measure Help needed turning from your back to your side while in a flat bed without using bedrails?: A Lot Help needed moving from lying on your back to sitting on the side of a flat bed without using bedrails?: A  Lot Help needed moving to and from a bed to a chair (including a wheelchair)?: A Lot Help needed standing up from a chair using your arms (e.g., wheelchair or bedside chair)?: Total Help needed to walk in hospital room?: Total Help needed climbing 3-5 steps with a railing? : Total 6 Click Score: 9    End of Session   Activity Tolerance: Patient limited by fatigue;Patient limited by pain;Treatment limited secondary to medical complications (Comment) Patient left: in bed;with call bell/phone within reach;with bed alarm set;with family/visitor present Nurse Communication: Mobility status PT Visit Diagnosis: Unsteadiness on feet (R26.81);Muscle weakness (generalized) (M62.81);Difficulty in walking, not elsewhere classified (R26.2);Pain Pain - Right/Left: Left Pain - part of body: Ankle and joints of foot    Time: 1431-1458 PT Time Calculation (min) (ACUTE ONLY): 27 min   Charges:   PT Evaluation $PT Eval Moderate Complexity: 1 Mod PT Treatments $Therapeutic Activity: 8-22 mins       Ivar Drape 09/25/2020, 9:56 PM Samul Dada, PT MS Acute Rehab Dept. Number: [facility] [phone] and [facility] [phone]

## 2020-09-25 NOTE — Consult Note (Signed)
WOC Nurse Consult Note: Patient receiving care in [facility] 2W01. Reason for Consult: skin tears to labia and buttocks Wound type: the etiology of these superficial areas--which are difficult to see because the patient resists-- are not at all clear to me.  Perhaps related to use of Purewick urinary system, but not certain of this. Pressure Injury POA: Yes/No/NA Measurement: Wound bed: pink Drainage (amount, consistency, odor) bloody Periwound: Dressing procedure/placement/frequency: discontinue use of Purewick, if possible, use Triple paste 4 times daily to areas. Thank you for the consult.  Discussed plan of care with the bedside nurse.  WOC nurse will not follow at this time.  Please re-consult the WOC team if needed.  Helmut Muster, RN, MSN, CWOCN, CNS-BC, pager [phone]

## 2020-09-26 ENCOUNTER — Inpatient Hospital Stay (HOSPITAL_COMMUNITY): Payer: Medicare Other

## 2020-09-26 LAB — CBC
HCT: 27.2 % — ABNORMAL LOW (ref 36.0–46.0)
Hemoglobin: 8.6 g/dL — ABNORMAL LOW (ref 12.0–15.0)
MCH: 26.2 pg (ref 26.0–34.0)
MCHC: 31.6 g/dL (ref 30.0–36.0)
MCV: 82.9 fL (ref 80.0–100.0)
Platelets: 376 10*3/uL (ref 150–400)
RBC: 3.28 MIL/uL — ABNORMAL LOW (ref 3.87–5.11)
RDW: 15.9 % — ABNORMAL HIGH (ref 11.5–15.5)
WBC: 5.1 10*3/uL (ref 4.0–10.5)
nRBC: 0 % (ref 0.0–0.2)

## 2020-09-26 LAB — BASIC METABOLIC PANEL
Anion gap: 7 (ref 5–15)
BUN: 17 mg/dL (ref 8–23)
CO2: 22 mmol/L (ref 22–32)
Calcium: 8.1 mg/dL — ABNORMAL LOW (ref 8.9–10.3)
Chloride: 107 mmol/L (ref 98–111)
Creatinine, Ser: 0.9 mg/dL (ref 0.44–1.00)
GFR, Estimated: >60 mL/min (ref >60)
Glucose, Bld: 105 mg/dL — ABNORMAL HIGH (ref 70–99)
Potassium: 3.7 mmol/L (ref 3.5–5.1)
Sodium: 136 mmol/L (ref 135–145)

## 2020-09-26 LAB — PHOSPHORUS: Phosphorus: 3.9 mg/dL (ref 2.5–4.6)

## 2020-09-26 LAB — MAGNESIUM: Magnesium: 2.1 mg/dL (ref 1.7–2.4)

## 2020-09-26 MED ORDER — OXYCODONE HCL 5 MG PO TABS
5.0000 mg | ORAL_TABLET | Freq: Once | ORAL | Status: AC
Start: 2020-09-26 — End: 2020-09-26
  Administered 2020-09-26: 5 mg via ORAL
  Filled 2020-09-26: qty 1

## 2020-09-26 NOTE — Progress Notes (Signed)
Orthopedic Tech Progress Note Patient Details:  Tammy Jones [birth year] [id number]  Casting Type of Cast: Short leg cast Cast Location: LLE Cast Material: Fiberglass Cast Intervention: Application  Post Interventions Patient Tolerated: Fair Instructions Provided: Care of device,Poper ambulation with device     Tammy Jones 09/26/2020, 5:48 PM

## 2020-09-26 NOTE — Progress Notes (Addendum)
PROGRESS NOTE    Tammy Jones  MGQ:[id number] DOB: Sep 28, 1931 DOA: 09/22/2020 PCP: Everlean Cherry, MD   Brief Narrative:  This 87 yrs old female with PMH significant for dementia, prior stroke, PAF not on anticoagulation anymore due to recurrent falls, hypertension.  Patient was living with her daughter three weeks ago, had sundowning at night and was asking repetitive questions, suffered mechanical fall and broke left tibia and fibula.  She was admitted in the [facility] and had open reduction internal fixation.  Hospital course was complicated by postoperative visual hallucinations and delirium.  Patient was subsequently stabilized and discharged to skilled nursing facility for rehab,  she was doing well until yesterday when patient's daughter went to see her mom,  she was very confused and has fluctuations in her mental status.  She has brought in the ED,  work-up found out that she has small pulmonary emboli on CTA chest. CT head: showed old strokes.  Patient was admitted for delirium and pulmonary embolism, she was started on Eliquis.  MRI showed evolving stroke.  Neurology was consulted, Patient had MRI last month, showing the same,  not a new stroke.  Patient is having delirium.  Assessment & Plan:   Principal Problem:   Pulmonary embolus (HCC) Active Problems:   Occipital stroke (HCC)   Paroxysmal atrial fibrillation (HCC)   Delirium   Dementia with behavioral disturbance (HCC)   Pulmonary embolism (HCC)   Delirium on pre-existing dementia: Patient was found to have fluctuations in her mental status at nursing home. Work-up in the ED,  found to have a small PE. CT head showed evolving stroke,  MRI showed the same. Neurology consulted,  Patient had MRI last month showing the same findings, there is no new stroke,  Patient is having delirium. Neurochecks every 6 hours. Frequent re-orientation Mental status changes may be due to patient being in new unfamiliar environment  and pre-existing dementia. Psych consulted, Patient reports intermittent hallucinations. Patient does have history of occipital stroke. Patient is started on Eliquis instead for PE, aspirin and Plavix kept on hold. New stroke ruled out, MRI showed chronic left PCA and bilateral cerebellar territory infarcts. Patient was started on low-dose Seroquel 25 mg at night for psychosis and prevention of delirium. Patient was seen by psychiatry.  No evidence of imminent risk to self or others, does not meet inpatient psych admission criteria. Neurology recommended outpatient ophthalmological evaluation and outpatient neurology follow-up in 4 to 6 weeks. PT/ OT evaluation> SNF  Pulmonary embolism: Patient is found to have small volume PE, from posterior tibial vein DVT seen on ultrasound. Continue telemetry. 2D Echo: Abnormal septal motion ? from LBBB. LVEF is 50 to 55%. The left ventricle has low normal  function. The left ventricle has no regional wall motion abnormalities.  There is mild left ventricular hypertrophy We will continue Eliquis. Monitor H&H.  Hb remains stable 8.6,  Daughter reports history of black stools in the past. Patient is also at high risk for falls. Long term A/C,  needs to be addressed given risks of mechanical falls.  Hypertension : Continue Losartan. BP controlled.  Paroxysmal A. fib.   Heart rate is controlled. She was not on any anticoagulation given risk of falls. But now patient started on Eliquis given PE and DVT. Continue Cardizem CD 240 mg daily  Left ankle fracture: Continue supportive care. Adequate pain control. Ortho consulted, patient needs removal of stitches.  Hypothyroidism: Continue levothyroxine.  Skin tear on buttocks: Wound care consulted,  Continue triple paste 4 times.  DVT prophylaxis: Eliquis Code Status: DNR Family Communication: Daughter at bed side. Disposition Plan:   Status is: Inpatient  Remains inpatient appropriate  because:Inpatient level of care appropriate due to severity of illness   Dispo: The patient is from:SNF              Anticipated d/c is to: SNF on 4/8              Patient currently is not medically stable to d/c.   Difficult to place patient No   Consultants:   Neurology    Procedures: Echocardiogram Antimicrobials:   Anti-infectives (From admission, onward)   None      Subjective: Patient was seen and examined at bedside.  Overnight events noted.    Patient denies any hallucinations. Patient is alert, awake, reports feeling better,  Patient denies any suicidal / homicidal ideations.   Objective: Vitals:   09/25/20 0849 09/25/20 1408 09/25/20 1947 09/25/20 2111  BP: (!) 161/80 (!) 114/55 114/77 (!) 158/82  Pulse: 72 63 91 (!) 59  Resp: 16 16 16 16   Temp: 97.9 F (36.6 C)  98.5 F (36.9 C) 98.4 F (36.9 C)  TempSrc: Oral  Oral Oral  SpO2: 98% 97% 97% 98%    Intake/Output Summary (Last 24 hours) at 09/26/2020 1100 Last data filed at 09/26/2020 0034 Gross per 24 hour  Intake --  Output 400 ml  Net -400 ml   There were no vitals filed for this visit.  Examination:  General exam: Appears calm and comfortable, not in any acute distress. Respiratory system: Clear to auscultation. Respiratory effort normal. Cardiovascular system: S1 & S2 heard, RRR. No JVD, murmurs, rubs, gallops or clicks. No pedal edema. Gastrointestinal system: Abdomen is nondistended, soft and nontender. No organomegaly or masses felt.  Normal bowel sounds heard. Central nervous system: Alert and oriented. No focal neurological deficits. Extremities:  No edema, no cyanosis, no clubbing. Left foot in caste, appear swollen. Skin: No rashes, lesions or ulcers Psychiatry: Judgement and insight appear normal. Mood & affect appropriate.     Data Reviewed: I have personally reviewed following labs and imaging studies  CBC: Recent Labs  Lab 09/22/20 1352 09/23/20 0410 09/24/20 0340  09/24/20 1817 09/25/20 0234 09/26/20 0033  WBC 6.8 6.2 5.2  --   --  5.1  NEUTROABS 5.0  --   --   --   --   --   HGB 9.3* 8.9* 8.5* 9.1* 8.6* 8.6*  HCT 29.0* 27.9* 26.7* 29.6* 27.0* 27.2*  MCV 83.1 83.5 83.7  --   --  82.9  PLT 345 321 368  --   --  376   Basic Metabolic Panel: Recent Labs  Lab 09/22/20 1352 09/23/20 0410 09/24/20 0340 09/25/20 0234 09/26/20 0033  NA 135 135 134* 137 136  K 3.6 3.5 3.8 3.6 3.7  CL 105 105 104 106 107  CO2 23 23 23 24 22   GLUCOSE 125* 110* 96 110* 105*  BUN 12 11 19 18 17   CREATININE 0.88 0.88 1.08* 1.03* 0.90  CALCIUM 8.1* 8.3* 8.1* 8.0* 8.1*  MG  --   --  2.2  --  2.1  PHOS  --   --  3.9  --  3.9   GFR: CrCl cannot be calculated (Unknown ideal weight.). Liver Function Tests: Recent Labs  Lab 09/22/20 1352  AST 32  ALT 30  ALKPHOS 97  BILITOT 0.7  PROT 6.1*  ALBUMIN 2.1*  No results for input(s): LIPASE, AMYLASE in the last 168 hours. No results for input(s): AMMONIA in the last 168 hours. Coagulation Profile: No results for input(s): INR, PROTIME in the last 168 hours. Cardiac Enzymes: No results for input(s): CKTOTAL, CKMB, CKMBINDEX, TROPONINI in the last 168 hours. BNP (last 3 results) No results for input(s): PROBNP in the last 8760 hours. HbA1C: No results for input(s): HGBA1C in the last 72 hours. CBG: Recent Labs  Lab 09/22/20 1251  GLUCAP 125*   Lipid Profile: No results for input(s): CHOL, HDL, LDLCALC, TRIG, CHOLHDL, LDLDIRECT in the last 72 hours. Thyroid Function Tests: No results for input(s): TSH, T4TOTAL, FREET4, T3FREE, THYROIDAB in the last 72 hours. Anemia Panel: No results for input(s): VITAMINB12, FOLATE, FERRITIN, TIBC, IRON, RETICCTPCT in the last 72 hours. Sepsis Labs: Recent Labs  Lab 09/22/20 1353  LATICACIDVEN 0.9    Recent Results (from the past 240 hour(s))  Urine culture     Status: Abnormal   Collection Time: 09/22/20  8:56 PM   Specimen: Urine, Clean Catch  Result Value Ref  Range Status   Specimen Description URINE, CLEAN CATCH  Final   Special Requests NONE  Final   Culture (A)  Final    >=100,000 COLONIES/mL GLOBICATELLA SANGUINIS Standardized susceptibility testing for this organism is not available. Performed at [facility], [address] [place], [place] [address]    Report Status 09/25/2020 FINAL  Final  SARS CORONAVIRUS 2 (TAT 6-24 HRS) Nasopharyngeal Nasopharyngeal Swab     Status: None   Collection Time: 09/23/20 12:18 AM   Specimen: Nasopharyngeal Swab  Result Value Ref Range Status   SARS Coronavirus 2 NEGATIVE NEGATIVE Final    Comment: (NOTE) SARS-CoV-2 target nucleic acids are NOT DETECTED.  The SARS-CoV-2 RNA is generally detectable in upper and lower respiratory specimens during the acute phase of infection. Negative results do not preclude SARS-CoV-2 infection, do not rule out co-infections with other pathogens, and should not be used as the sole basis for treatment or other patient management decisions. Negative results must be combined with clinical observations, patient history, and epidemiological information. The expected result is Negative.  Fact Sheet for Patients: HairSlick.no  Fact Sheet for Healthcare Providers: quierodirigir.com  This test is not yet approved or cleared by the [place] FDA and  has been authorized for detection and/or diagnosis of SARS-CoV-2 by FDA under an Emergency Use Authorization (EUA). This EUA will remain  in effect (meaning this test can be used) for the duration of the COVID-19 declaration under Se ction 564(b)(1) of the Act, 21 U.S.C. section 360bbb-3(b)(1), unless the authorization is terminated or revoked sooner.  Performed at [facility], [address] [place], [place] [address]     Radiology Studies: CT ABDOMEN PELVIS WO CONTRAST  Result Date: 09/24/2020 CLINICAL DATA:  87 year old female with bloody stool.  EXAM: CT ABDOMEN AND PELVIS WITHOUT CONTRAST TECHNIQUE: Multidetector CT imaging of the abdomen and pelvis was performed following the standard protocol without IV contrast. COMPARISON:  None. FINDINGS: Evaluation of this exam is limited in the absence of intravenous contrast. Lower chest: The visualized lung bases are clear. There is calcification of the mitral annulus. No intra-abdominal free air or free fluid. Hepatobiliary: The liver is unremarkable. No intrahepatic biliary dilatation. Small gallstone. No pericholecystic fluid or evidence of acute cholecystitis by CT. Pancreas: Unremarkable. No pancreatic ductal dilatation or surrounding inflammatory changes. Spleen: Normal in size without focal abnormality. Adrenals/Urinary Tract: The left adrenal glands  unremarkable. There is a 15 mm right adrenal adenoma. Left renal upper pole parenchyma atrophy. A 2.5 cm right renal upper pole hypodense lesion which is suboptimally characterized on this noncontrast CT, likely a cyst. There is no hydronephrosis or nephrolithiasis on either side. The visualized ureters and urinary bladder appear unremarkable. Stomach/Bowel: There is moderate stool throughout the colon. There is sigmoid diverticulosis without active inflammatory changes. There is no bowel obstruction. The appendix is not visualized with certainty. No inflammatory changes identified in the right lower quadrant. Vascular/Lymphatic: Advanced aortoiliac atherosclerotic disease. The IVC is unremarkable. No portal venous gas. There is no adenopathy. Reproductive: Hysterectomy.  No adnexal masses. Other: None Musculoskeletal: Osteopenia with degenerative changes of the spine. There is a total right hip arthroplasty. No acute osseous pathology. IMPRESSION: 1. No acute intra-abdominal or pelvic pathology. 2. Sigmoid diverticulosis. No bowel obstruction. 3. Cholelithiasis. 4. Right adrenal adenoma. 5. Aortic Atherosclerosis (ICD10-I70.0). Electronically Signed   By:  Elgie CollardArash  Radparvar M.D.   On: 09/24/2020 19:36    Scheduled Meds: . apixaban  10 mg Oral BID   Followed by  . [START ON 09/30/2020] apixaban  5 mg Oral BID  . atorvastatin  40 mg Oral Daily  . brimonidine  1 drop Both Eyes q AM  . diltiazem  240 mg Oral q AM  . dorzolamide-timolol  1 drop Both Eyes q AM  . escitalopram  5 mg Oral q AM  . gabapentin  100 mg Oral q AM  . levothyroxine  75 mcg Oral QAC breakfast  . losartan  100 mg Oral q AM  . pantoprazole  40 mg Oral q AM  . QUEtiapine  25 mg Oral QHS  . senna  1 tablet Oral BID  . sucralfate  1 g Oral BID  . Vitamin D (Ergocalciferol)  50,000 Units Oral Q7 days  . Zinc Oxide   Topical QID   Continuous Infusions:   LOS: 2 days    Time spent: 25 mins    Duane Earnshaw, MD [facility]   If 7PM-7AM, please contact night-coverage

## 2020-09-26 NOTE — Progress Notes (Signed)
This nurse received a call from DR. Durrani who is an IT trainer from [place]. He states the patient will need to have her sutures and splint removed today and either a cast applied or a boot. This nurse relayed the message to Dr.  Lucianne Muss. Currently awaiting new orders.

## 2020-09-26 NOTE — [place] FL2 (Signed)
[place] MEDICAID FL2 LEVEL OF CARE SCREENING TOOL     IDENTIFICATION  Patient Name: Tammy Jones Birthdate: July 29, 1931 Sex: female Admission Date (Current Location): 09/22/2020  [facility] and [name] Number:  Producer, television/film/video and Address:  [facility] [place], [address], [place], [place] [address]      Provider Number: [id number]  Attending Physician Name and Address:  Cipriano Bunker, MD  Relative Name and Phone Number:  [facility] Daughter   [phone]    Current Level of Care: Hospital Recommended Level of Care: Skilled Nursing Facility Prior Approval Number:    Date Approved/Denied:   PASRR Number: [id number]  Discharge Plan: SNF    Current Diagnoses: Patient Active Problem List   Diagnosis Date Noted  . Pulmonary embolism (HCC) 09/24/2020  . Delirium 09/22/2020  . Pulmonary embolus (HCC) 09/22/2020  . Dementia with behavioral disturbance (HCC) 09/22/2020  . 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 At high risk for falls 11/28/2019  . 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 MCI (mild cognitive impairment) with memory loss 11/28/2019  . Frailty 05/31/2019  . ACP (advance care planning) 04/30/2019  . Occipital stroke (HCC) 04/05/2019  . Myocardial infarction type 2 (HCC) 05/24/2018  . Presbyopia 01/03/2018  . Regular astigmatism of both eyes 12/17/2017  . Iron deficiency anemia 08/20/2017  . Essential hypertension with goal blood pressure less than 130/80 03/05/2017  . Closed fracture of transverse process of lumbar vertebra with routine healing 02/10/2017  . At risk for bleeding associated with anticoagulants 02/09/2017  . Hypertensive heart disease without CHF 11/12/2016  . Peripheral arterial disease (HCC) 09/18/2016  . Paroxysmal atrial fibrillation (HCC) 09/08/2016  . Coronary artery disease involving native coronary artery of native heart without angina pectoris 08/27/2016  . Allergic conjunctivitis of both  eyes 07/24/2016  . LBBB (left bundle branch block) 06/17/2016  . Bilateral dry eyes 05/06/2015  . Impaired fasting glucose 04/11/2015  . Osteopenia 04/03/2015  . Nonexudative age-related macular degeneration 03/22/2015  . Hyperlipidemia 06/12/2014  . Acquired hypothyroidism 04/26/2014  . Osteoarthritis of knee 04/26/2014  . Primary open-angle glaucoma 12/23/2011  . Pseudophakia 12/23/2011    Orientation RESPIRATION BLADDER Height & Weight     Self  Normal Incontinent Weight:   Height:     BEHAVIORAL SYMPTOMS/MOOD NEUROLOGICAL BOWEL NUTRITION STATUS      Incontinent Diet (Heart diet.  See discharge summary)  AMBULATORY STATUS COMMUNICATION OF NEEDS Skin   Total Care Verbally Normal                       Personal Care Assistance Level of Assistance  Bathing,Feeding,Dressing Bathing Assistance: Maximum assistance Feeding assistance: Independent Dressing Assistance: Maximum assistance     Functional Limitations Info  Sight,Hearing,Speech Sight Info: Adequate Hearing Info: Adequate Speech Info: Adequate    SPECIAL CARE FACTORS FREQUENCY  PT (By licensed PT),OT (By licensed OT)     PT Frequency: 5x week OT Frequency: 5x week            Contractures Contractures Info: Not present    Additional Factors Info  Code Status,Allergies Code Status Info: DNR Allergies Info: Apixaban, Hydrocodone, Morphine, Penicillins, Other           Current Medications (09/26/2020):  This is the current hospital active medication list Current Facility-Administered Medications  Medication Dose Route Frequency Provider Last Rate Last Admin  . acetaminophen (TYLENOL) tablet 650 mg  650 mg Oral Q6H PRN Hillary Bow, DO  650 mg at 09/25/20 2056   Or  . acetaminophen (TYLENOL) suppository 650 mg  650 mg Rectal Q6H PRN Hillary Bow, DO      . apixaban Everlene Balls) tablet 10 mg  10 mg Oral BID Koleen Distance, MD   10 mg at 09/26/20 0845   Followed by  . [START ON 09/30/2020]  apixaban (ELIQUIS) tablet 5 mg  5 mg Oral BID Koleen Distance, MD      . atorvastatin (LIPITOR) tablet 40 mg  40 mg Oral Daily Koleen Distance, MD   40 mg at 09/26/20 0846  . brimonidine (ALPHAGAN) 0.2 % ophthalmic solution 1 drop  1 drop Both Eyes q AM Koleen Distance, MD   1 drop at 09/26/20 0846  . diltiazem (CARDIZEM CD) 24 hr capsule 240 mg  240 mg Oral q AM Koleen Distance, MD   240 mg at 09/26/20 1016  . dorzolamide-timolol (COSOPT) 22.3-6.8 MG/ML ophthalmic solution 1 drop  1 drop Both Eyes q AM Koleen Distance, MD   1 drop at 09/26/20 0846  . escitalopram (LEXAPRO) tablet 5 mg  5 mg Oral q AM Koleen Distance, MD   5 mg at 09/26/20 1015  . gabapentin (NEURONTIN) capsule 100 mg  100 mg Oral q AM Koleen Distance, MD   100 mg at 09/26/20 1016  . levothyroxine (SYNTHROID) tablet 75 mcg  75 mcg Oral QAC breakfast Koleen Distance, MD   75 mcg at 09/26/20 0846  . LORazepam (ATIVAN) injection 1 mg  1 mg Intravenous Once PRN Hillary Bow, DO      . losartan (COZAAR) tablet 100 mg  100 mg Oral q AM Koleen Distance, MD   100 mg at 09/26/20 1015  . ondansetron (ZOFRAN) tablet 4 mg  4 mg Oral Q6H PRN Hillary Bow, DO       Or  . ondansetron [facility]) injection 4 mg  4 mg Intravenous Q6H PRN Hillary Bow, DO      . pantoprazole (PROTONIX) EC tablet 40 mg  40 mg Oral q AM Koleen Distance, MD   40 mg at 09/26/20 1015  . QUEtiapine (SEROQUEL) tablet 25 mg  25 mg Oral QHS Maryagnes Amos, FNP   25 mg at 09/25/20 2044  . senna (SENOKOT) tablet 8.6 mg  1 tablet Oral BID Cipriano Bunker, MD   8.6 mg at 09/26/20 0846  . sucralfate (CARAFATE) 1 GM/10ML suspension 1 g  1 g Oral BID Koleen Distance, MD   1 g at 09/26/20 0845  . Vitamin D (Ergocalciferol) (DRISDOL) capsule 50,000 Units  50,000 Units Oral Q7 days Cipriano Bunker, MD   50,000 Units at 09/24/20 1021  . Zinc Oxide (TRIPLE PASTE) 12.8 % ointment   Topical QID Cipriano Bunker, MD   1 application at 09/26/20 1000     Discharge Medications: Please  see discharge summary for a list of discharge medications.  Relevant Imaging Results:  Relevant Lab Results:   Additional Information SSN [id number].  Pt is vaccinated and boosted for covid.  Lorri Frederick, LCSW

## 2020-09-26 NOTE — Progress Notes (Signed)
Patient Sutures removed by this RN. Initial attempt to remove sutures was too painful for patient so pain medication administered prior to suture removal. Patient tolerated removal well, this nurse applied betadine and nonadherant pads, Kerlix, and ace wrap applied until ortho tech can come up and apply new cast. This nurse called ortho tech who states he will come by the floor as soon as he can to apply new cast.

## 2020-09-26 NOTE — Consult Note (Signed)
Reason for Consult:Left ankle fx Referring Physician: Casper Harrison Time called: [id number] Time at bedside: 57   Tammy Jones is an 87 y.o. female.  HPI: Tammy Jones broke her ankle about 3w ago and underwent ORIF in [facility]. Subsequently she suffered a DVT/PE and CVA and was admitted to [facility] for treatment. She was due to have her post-op appt with ortho this week so orthopedic surgery was consulted for recommendations. She is demented and cannot contribute much to history but follows commands reliably.  Past Medical History:  Diagnosis Date  . Alzheimer's disease (HCC)   . Atrial fibrillation (HCC)   . 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 GERD (gastroesophageal reflux disease)   . Hyperlipidemia   . Hypertension     Past Surgical History:  Procedure Laterality Date  . ORIF TIBIA & FIBULA FRACTURES Left 2022    Family History  Problem Relation Age of Onset  . Diabetes Sister   . Stroke Neg Hx     Social History:  reports that she has quit smoking. She has a 10.00 pack-year smoking history. She does not have any smokeless tobacco history on file. She reports that she does not drink alcohol and does not use drugs.  Allergies:  Allergies  Allergen Reactions  . Apixaban Diarrhea  . Hydrocodone Nausea And Vomiting  . Morphine Other (See Comments)    Hallucinations  . Penicillins Other (See Comments)    "Allergic," per MAR  . Other Rash    LOBSTER    Medications: I have reviewed the patient's current medications.  Results for orders placed or performed during the hospital encounter of 09/22/20 (from the past 48 hour(s))  Hemoglobin and hematocrit, blood     Status: Abnormal   Collection Time: 09/24/20  6:17 PM  Result Value Ref Range   Hemoglobin 9.1 (L) 12.0 - 15.0 g/dL   HCT 41.9 (L) 62.2 - 29.7 %    Comment: Performed at [facility], [address] [place], [place] [address]  Hemoglobin and hematocrit, blood     Status:  Abnormal   Collection Time: 09/25/20  2:34 AM  Result Value Ref Range   Hemoglobin 8.6 (L) 12.0 - 15.0 g/dL   HCT 19.4 (L) 17.4 - 08.1 %    Comment: Performed at [facility], [address], [place], [place] [address]  Basic metabolic panel     Status: Abnormal   Collection Time: 09/25/20  2:34 AM  Result Value Ref Range   Sodium 137 135 - 145 mmol/L   Potassium 3.6 3.5 - 5.1 mmol/L   Chloride 106 98 - 111 mmol/L   CO2 24 22 - 32 mmol/L   Glucose, Bld 110 (H) 70 - 99 mg/dL    Comment: Glucose reference range applies only to samples taken after fasting for at least 8 hours.   BUN 18 8 - 23 mg/dL   Creatinine, Ser 5.63 (H) 0.44 - 1.00 mg/dL   Calcium 8.0 (L) 8.9 - 10.3 mg/dL   GFR, Estimated 53 (L) >60 mL/min    Comment: (NOTE) Calculated using the CKD-EPI Creatinine Equation (2021)    Anion gap 7 5 - 15    Comment: Performed at [facility], [address], [place], [place] [address]  CBC     Status: Abnormal   Collection Time: 09/26/20 12:33 AM  Result Value Ref Range   WBC 5.1 4.0 - 10.5 K/uL   RBC  3.28 (L) 3.87 - 5.11 MIL/uL   Hemoglobin 8.6 (L) 12.0 - 15.0 g/dL   HCT 34.7 (L) 42.5 - 95.6 %   MCV 82.9 80.0 - 100.0 fL   MCH 26.2 26.0 - 34.0 pg   MCHC 31.6 30.0 - 36.0 g/dL   RDW 38.7 (H) 56.4 - 33.2 %   Platelets 376 150 - 400 K/uL   nRBC 0.0 0.0 - 0.2 %    Comment: Performed at [facility], [address] [place], [place] [address]  Magnesium     Status: None   Collection Time: 09/26/20 12:33 AM  Result Value Ref Range   Magnesium 2.1 1.7 - 2.4 mg/dL    Comment: Performed at [facility], [address] [place], [place] [address]  Phosphorus     Status: None   Collection Time: 09/26/20 12:33 AM  Result Value Ref Range   Phosphorus 3.9 2.5 - 4.6 mg/dL    Comment: Performed at [facility], [address], [place], [place] [address]  Basic metabolic panel     Status: Abnormal   Collection Time: 09/26/20 12:33 AM  Result Value Ref  Range   Sodium 136 135 - 145 mmol/L   Potassium 3.7 3.5 - 5.1 mmol/L   Chloride 107 98 - 111 mmol/L   CO2 22 22 - 32 mmol/L   Glucose, Bld 105 (H) 70 - 99 mg/dL    Comment: Glucose reference range applies only to samples taken after fasting for at least 8 hours.   BUN 17 8 - 23 mg/dL   Creatinine, Ser 0.10 0.44 - 1.00 mg/dL   Calcium 8.1 (L) 8.9 - 10.3 mg/dL   GFR, Estimated >93 >23 mL/min    Comment: (NOTE) Calculated using the CKD-EPI Creatinine Equation (2021)    Anion gap 7 5 - 15    Comment: Performed at [facility], [address], [place], [place] [address]    CT ABDOMEN PELVIS WO CONTRAST  Result Date: 09/24/2020 CLINICAL DATA:  87 year old female with bloody stool. EXAM: CT ABDOMEN AND PELVIS WITHOUT CONTRAST TECHNIQUE: Multidetector CT imaging of the abdomen and pelvis was performed following the standard protocol without IV contrast. COMPARISON:  None. FINDINGS: Evaluation of this exam is limited in the absence of intravenous contrast. Lower chest: The visualized lung bases are clear. There is calcification of the mitral annulus. No intra-abdominal free air or free fluid. Hepatobiliary: The liver is unremarkable. No intrahepatic biliary dilatation. Small gallstone. No pericholecystic fluid or evidence of acute cholecystitis by CT. Pancreas: Unremarkable. No pancreatic ductal dilatation or surrounding inflammatory changes. Spleen: Normal in size without focal abnormality. Adrenals/Urinary Tract: The left adrenal glands unremarkable. There is a 15 mm right adrenal adenoma. Left renal upper pole parenchyma atrophy. A 2.5 cm right renal upper pole hypodense lesion which is suboptimally characterized on this noncontrast CT, likely a cyst. There is no hydronephrosis or nephrolithiasis on either side. The visualized ureters and urinary bladder appear unremarkable. Stomach/Bowel: There is moderate stool throughout the colon. There is sigmoid diverticulosis without active inflammatory  changes. There is no bowel obstruction. The appendix is not visualized with certainty. No inflammatory changes identified in the right lower quadrant. Vascular/Lymphatic: Advanced aortoiliac atherosclerotic disease. The IVC is unremarkable. No portal venous gas. There is no adenopathy. Reproductive: Hysterectomy.  No adnexal masses. Other: None Musculoskeletal: Osteopenia with degenerative changes of the spine. There is a total right hip arthroplasty. No acute osseous pathology. IMPRESSION: 1. No acute  intra-abdominal or pelvic pathology. 2. Sigmoid diverticulosis. No bowel obstruction. 3. Cholelithiasis. 4. Right adrenal adenoma. 5. Aortic Atherosclerosis (ICD10-I70.0). Electronically Signed   By: Elgie Collard M.D.   On: 09/24/2020 19:36    Review of Systems  Unable to perform ROS: Dementia   Blood pressure (!) 158/82, pulse (!) 59, temperature 98.4 F (36.9 C), temperature source Oral, resp. rate 16, SpO2 98 %. Physical Exam Constitutional:      General: She is not in acute distress.    Appearance: She is well-developed. She is not diaphoretic.  HENT:     Head: Normocephalic and atraumatic.  Eyes:     General: No scleral icterus.       Right eye: No discharge.        Left eye: No discharge.     Conjunctiva/sclera: Conjunctivae normal.  Cardiovascular:     Rate and Rhythm: Normal rate and regular rhythm.  Pulmonary:     Effort: Pulmonary effort is normal. No respiratory distress.  Musculoskeletal:     Cervical back: Normal range of motion.     Comments: LLE No traumatic wounds, ecchymosis, or rash  Short leg splint in place  No knee effusion  Knee stable to varus/ valgus and anterior/posterior stress, mildly TTP  Sens DPN, SPN, TN intact  Motor EHL 5/5  Toes perfused, No significant edema  Skin:    General: Skin is warm and dry.  Neurological:     Mental Status: She is alert.  Psychiatric:        Behavior: Behavior normal.     Assessment/Plan: Left ankle fx -- DC  sutures, continue NWB, Will replace splint with cast.    Freeman Caldron, PA-C Orthopedic Surgery [phone] 09/26/2020, 9:58 AM

## 2020-09-26 NOTE — Plan of Care (Signed)

## 2020-09-27 ENCOUNTER — Other Ambulatory Visit: Payer: Self-pay

## 2020-09-27 ENCOUNTER — Other Ambulatory Visit (HOSPITAL_COMMUNITY): Payer: Self-pay

## 2020-09-27 ENCOUNTER — Encounter (HOSPITAL_COMMUNITY): Payer: Self-pay | Admitting: Family Medicine

## 2020-09-27 LAB — HEMOGLOBIN AND HEMATOCRIT, BLOOD
HCT: 27.1 % — ABNORMAL LOW (ref 36.0–46.0)
Hemoglobin: 8.3 g/dL — ABNORMAL LOW (ref 12.0–15.0)

## 2020-09-27 LAB — RESP PANEL BY RT-PCR (FLU A&B, COVID) ARPGX2
Influenza A by PCR: NEGATIVE
Influenza B by PCR: NEGATIVE
SARS Coronavirus 2 by RT PCR: NEGATIVE

## 2020-09-27 MED ORDER — QUETIAPINE FUMARATE 25 MG PO TABS
25.0000 mg | ORAL_TABLET | Freq: Every day | ORAL | 1 refills | Status: AC
  Filled 2020-09-27: qty 30, 30d supply, fill #0

## 2020-09-27 MED ORDER — APIXABAN 5 MG PO TABS
5.0000 mg | ORAL_TABLET | Freq: Two times a day (BID) | ORAL | 1 refills | Status: AC
  Filled 2020-09-27: qty 64, 30d supply, fill #0

## 2020-09-27 MED ORDER — ZINC OXIDE 12.8 % EX OINT
TOPICAL_OINTMENT | Freq: Four times a day (QID) | CUTANEOUS | 0 refills | Status: AC
  Filled 2020-09-27: qty 56.7, fill #0

## 2020-09-27 MED ORDER — VITAMIN D (ERGOCALCIFEROL) 1.25 MG (50000 UNIT) PO CAPS
50000.0000 [IU] | ORAL_CAPSULE | ORAL | 0 refills | Status: AC
  Filled 2020-09-27: qty 5, 35d supply, fill #0

## 2020-09-27 MED ORDER — APIXABAN 5 MG PO TABS
10.0000 mg | ORAL_TABLET | Freq: Two times a day (BID) | ORAL | 0 refills | Status: AC
  Filled 2020-09-27: qty 4, 1d supply, fill #0

## 2020-09-27 NOTE — TOC Initial Note (Signed)
Transition of Care [facility]) - Initial/Assessment Note    Patient Details  Name: Tammy Jones MRN: [id number] Date of Birth: [birth year]  [facility] CM/SW Contact:    Tammy Chars, LCSW Phone Number: 09/27/2020, 1:36 PM  Clinical Narrative:    CSW met with pt regarding discharge.  Pt unable to participate in assessment but does give permission to speak with daughter Tammy Jones.  CSW spoke with daughter who does want pt to return to [facility], which is where she was prior to admission.  Pt is vaccinated for covid and boosted.                Expected Discharge Plan: Skilled Nursing Facility Barriers to Discharge: Continued Medical Work up   Patient Goals and CMS Choice   CMS Medicare.gov Compare Post Acute Care list provided to::  (pt returning to [place] and rehab)    Expected Discharge Plan and Services Expected Discharge Plan: [place] Choice: [place] arrangements for the past 2 months: Single Family Home Expected Discharge Date: 09/27/20                                    Prior Living Arrangements/Services Living arrangements for the past 2 months: Single Family Home Lives with:: Adult Children   Do you feel safe going back to the place where you live?: Yes      Need for Family Participation in Patient Care: Yes (Comment) Care giver support system in place?: Yes (comment) Current home services: Other (comment) (none) Criminal Activity/Legal Involvement Pertinent to Current Situation/Hospitalization: No - Comment as needed  Activities of Daily Living Home Assistive Devices/Equipment: Dentures (specify type) ADL Screening (condition at time of admission) Patient's cognitive ability adequate to safely complete daily activities?: No Is the patient deaf or have difficulty hearing?: No Does the patient have difficulty seeing, even when wearing glasses/contacts?:  Yes Does the patient have difficulty concentrating, remembering, or making decisions?: Yes Patient able to express need for assistance with ADLs?: No Does the patient have difficulty dressing or bathing?: Yes Independently performs ADLs?: No Does the patient have difficulty walking or climbing stairs?: Yes Weakness of Legs: Left Weakness of Arms/Hands: None  Permission Sought/Granted Permission sought to share information with : Family Supports Permission granted to share information with : Yes, Verbal Permission Granted  Share Information with NAME: daugher Tammy Jones           Emotional Assessment Appearance:: Appears stated age Attitude/Demeanor/Rapport: Unable to Assess Affect (typically observed): Unable to Assess Orientation: : Oriented to Self,Oriented to Place Alcohol / Substance Use: Not Applicable Psych Involvement: No (comment)  Admission diagnosis:  Delirium [R41.0] Pulmonary embolus (HCC) [I26.99] MCI (mild cognitive impairment) with memory loss [G31.84] Occipital stroke (HCC) [I63.9] Deep vein thrombosis (DVT) of tibial vein of left lower extremity, unspecified chronicity (HCC) [I82.442] Pulmonary embolism ([place]) [I26.99] Patient Active Problem List   Diagnosis Date Noted  . Pulmonary embolism ([place]) 09/24/2020  . Delirium 09/22/2020  . Pulmonary embolus ([place]) 09/22/2020  . Dementia with behavioral disturbance ([place]) 09/22/2020  . 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 At high risk for falls 11/28/2019  . 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 MCI (mild cognitive impairment) with memory loss 11/28/2019  . Frailty 05/31/2019  . ACP (advance care planning) 04/30/2019  . Occipital stroke ([place]) 04/05/2019  .  Myocardial infarction type 2 ([place]) 05/24/2018  . Presbyopia 01/03/2018  . Regular astigmatism of both eyes 12/17/2017  . Iron deficiency anemia 08/20/2017  . Essential hypertension with goal blood pressure less than 130/80 03/05/2017  . Closed  fracture of transverse process of lumbar vertebra with routine healing 02/10/2017  . At risk for bleeding associated with anticoagulants 02/09/2017  . Hypertensive heart disease without CHF 11/12/2016  . Peripheral arterial disease ([place]) 09/18/2016  . Paroxysmal atrial fibrillation ([place]) 09/08/2016  . Coronary artery disease involving native coronary artery of native heart without angina pectoris 08/27/2016  . Allergic conjunctivitis of both eyes 07/24/2016  . LBBB (left bundle branch block) 06/17/2016  . Bilateral dry eyes 05/06/2015  . Impaired fasting glucose 04/11/2015  . Osteopenia 04/03/2015  . Nonexudative age-related macular degeneration 03/22/2015  . Hyperlipidemia 06/12/2014  . Acquired hypothyroidism 04/26/2014  . Osteoarthritis of knee 04/26/2014  . Primary open-angle glaucoma 12/23/2011  . Pseudophakia 12/23/2011   PCP:  Maris Berger, MD Pharmacy:   [facility], [address] [place] [address] Phone: [phone] Fax: [place] [address] [place] [address] Phone: [phone] Fax: [phone]     Social Determinants of Health (SDOH) Interventions    Readmission Risk Interventions No flowsheet data found.

## 2020-09-27 NOTE — Progress Notes (Signed)
This nurse gave telephone report to Jacki Cones, Charity fundraiser at [facility]. All questions answered at this time

## 2020-09-27 NOTE — Care Management Important Message (Signed)
Important Message  Patient Details  Name: Harlynn Kimbell MRN: [id number] Date of Birth: [birth year]   Medicare Important Message Given:  Yes     Trinitie Mcgirr Stefan Church 09/27/2020, 2:20 PM

## 2020-09-27 NOTE — TOC Transition Note (Signed)
Transition of Care [facility]) - CM/SW Discharge Note   Patient Details  Name: Tammy Jones MRN: [id number] Date of Birth: May 10, 1932  [facility] CM/SW Contact:  Lorri Frederick, LCSW Phone Number: 09/27/2020, 1:38 PM   Clinical Narrative:   PT discharging to [facility], Room 106.  RN call report to [phone].  CSW called [employer] regarding authorization and was informed insurance no longer active as of 3/31.  CSW spoke with [name] who confirmed that pt has switched back to tradtional medicare as of that date.  CSW spoke with Irving Burton at [place] and she confirmed that they were aware of this switch, no auth needed.    Final next level of care: Skilled Nursing Facility Barriers to Discharge: Barriers Resolved   Patient Goals and CMS Choice   CMS Medicare.gov Compare Post Acute Care list provided to::  (pt returning to [facility] and rehab)    Discharge Placement              Patient chooses bed at:  [facility] rehab) Patient to be transferred to facility by: [facility] Name of family member notified: Tresa Endo, daughter Patient and family notified of of transfer: 09/27/20  Discharge Plan and Services     Post Acute Care Choice: Skilled Nursing Facility                               Social Determinants of Health (SDOH) Interventions     Readmission Risk Interventions No flowsheet data found.

## 2020-09-27 NOTE — Consult Note (Signed)
WOC Nurse Consult Note:  Patient receiving care in [facility] 2W01. Reason for Consult: RN WOC consult order requesting guidance on new skin tear On 4/6 I saw the patient and ordered Triple Paste to the buttocks,vaginal area, all areas with "skin tears".  I have entered a nursing instruction order today explaining to use the Triple Paste as often as necessary to these areas, as there is not a dressing that will stick to these sites. WOC nurse will not follow at this time.  Please re-consult the WOC team if needed.  Helmut Muster, RN, MSN, CWOCN, CNS-BC, pager [phone]

## 2020-09-27 NOTE — Discharge Summary (Addendum)
Physician Discharge Summary  Jalene Mullet ZOX:[id number] DOB: Aug 02, 1931 DOA: 09/22/2020  PCP: Everlean Cherry, MD  Admit date: 09/22/2020   Discharge date: 09/27/2020  Admitted From:   SNF  Disposition:   SNF  Recommendations for Outpatient Follow-up:  1. Follow up with PCP in 1-2 weeks. 2. Please obtain BMP/CBC in one week. 3.   Advised to follow-up wit Neurologist in 4-6 weeks. 4.   Advised to follow-up with ophthalmologist for comprehensive eye exam in 1-2 weeks 5.   Patient's aspirin and Plavix was discontinued.  Patient is started on Eliquis for small PE. 6.   Advised to take Eliquis 10 mg twice daily for 3 days followed by 5 mg twice daily.  7.   Advised to have outpatient Formal  neuropsych evaluation.  Home Health: None. Equipment/Devices: None.  Discharge Condition: Stable CODE STATUS:DNR Diet recommendation: Heart Healthy   Brief Summary: This 87 yrs old female with PMH significant for dementia, prior stroke, PAF not on anticoagulation anymore due to recurrent falls, hypertension. Patient was living with her daughter three weeks ago, had sundowning at night and was asking repetitive questions, suffered mechanical fall and broke left tibia and fibula.  She was admitted in the [facility] and had open reduction internal fixation.  Hospital course was complicated by postoperative visual hallucinations and delirium.  Patient was subsequently stabilized and discharged to skilled nursing facility for rehab,  she was doing well until yesterday when patient's daughter went to see her mom,  she was very confused and has fluctuations in her mental status.  She has brought in the ED,  work-up found out that she has small pulmonary emboli on CTA chest. CT head: showed old strokes.   Hospital Course: Patient was admitted for delirium and pulmonary embolism, she was started on Eliquis.  MRI showed evolving stroke.  Neurology was consulted, Patient had MRI last month, showing the same,   not a new stroke.  Patient is having delirium.  Neurology recommended outpatient neurology follow-up in 4 to 6 weeks, formal neuro psychiatric evaluation outpatient, advised to continue Eliquis, ophthalmology evaluation in 1 to 2 weeks.  Patient was also seen by psychiatry for possible delirium and was started on low-dose Seroquel which has worked fine patient denies further visual hallucinations.  Orthopedics was consulted because patient was supposed to see the orthopedics at the time, stitches were removed cast was changed.  Patient feels much better and being discharged to skilled nursing facility today for rehab.  Patient has skin tears noted on the buttocks, were very superficial started on topical antibiotics.  She was managed for below problems.  Discharge Diagnoses:  Principal Problem:   Pulmonary embolus (HCC) Active Problems:   Occipital stroke (HCC)   Paroxysmal atrial fibrillation (HCC)   Delirium   Dementia with behavioral disturbance (HCC)   Pulmonary embolism (HCC)  Delirium on pre-existing dementia: > Improving Patient was found to have fluctuations in her mental status at nursing home. Work-up in the ED,  found to have a small PE. CT head showed evolving stroke,  MRI showed the same. Neurology consulted,  Patient had MRI last month showing the same findings, there is no new stroke,  Patient is having delirium. Neurochecks every 6 hours. Frequent re-orientation Mental status changes may be due to patient being in new unfamiliar environment and pre-existing dementia. Psych consulted, Patient reports intermittent hallucinations. Patient does have history of occipital stroke. Patient is started on Eliquis instead for PE, aspirin and Plavix kept on hold.  New stroke ruled out, MRI showed chronic left PCA and bilateral cerebellar territory infarcts. Patient was started on low-dose Seroquel 25 mg at night for psychosis and prevention of delirium. Patient was seen by psychiatry.   No evidence of imminent risk to self or others, does not meet inpatient psych admission criteria. Neurology recommended outpatient ophthalmological evaluation and outpatient neurology follow-up in 4 to 6 weeks. PT/ OT evaluation> SNF  Pulmonary embolism: Patient is found to have small volume PE, from posterior tibial vein DVT seen on ultrasound. Continue telemetry. 2D Echo: Abnormal septal motion ? from LBBB. LVEF is 50 to 55%. The left ventricle has low normal  function. The left ventricle has no regional wall motion abnormalities.  There is mild left ventricular hypertrophy We will continue Eliquis. Monitor H&H.  Hb remains stable 8.6,  Daughter reports history of black stools in the past. Patient is also at high risk for falls. Long term A/C,  needs to be addressed given risks of mechanical falls. Hemoglobin remains stable, patient does not have any black stools in the hospital.  Hypertension : Continue Losartan. BP controlled.  Paroxysmal A. fib.   Heart rate is controlled. She was not on any anticoagulation given risk of falls. But now patient started on Eliquis given PE and DVT. Continue Cardizem CD 240 mg daily  Left ankle fracture: Continue supportive care. Adequate pain control. Ortho consulted, stitches removed, caste changed.  Hypothyroidism: Continue levothyroxine.  Skin tear on buttocks: Wound care consulted,  Continue triple paste 4 times.  Discharge Instructions  Discharge Instructions    Call MD for:  difficulty breathing, headache or visual disturbances   Complete by: As directed    Call MD for:  persistant dizziness or light-headedness   Complete by: As directed    Call MD for:  persistant nausea and vomiting   Complete by: As directed    Diet - low sodium heart healthy   Complete by: As directed    Diet Carb Modified   Complete by: As directed    Discharge instructions   Complete by: As directed    Advised to follow-up with primary care  physician in 1 week. Advised to follow-up wit Neurologist in 3 to 4 weeks. Advised to follow-up with ophthalmologist for comprehensive eye exam. Patient's aspirin and Plavix was discontinued.  Patient is started on Eliquis for small PE. Advised to take Eliquis 10 mg twice daily for 3 days followed by 5 mg twice daily.   Discharge wound care:   Complete by: As directed    Discharge wound care at nursing home.   Increase activity slowly   Complete by: As directed      Allergies as of 09/27/2020      Reactions   Apixaban Diarrhea   Hydrocodone Nausea And Vomiting   Morphine Other (See Comments)   Hallucinations   Penicillins Other (See Comments)   "Allergic," per MAR   Other Rash   LOBSTER      Medication List    STOP taking these medications   aspirin EC 81 MG tablet   clopidogrel 75 MG tablet Commonly known as: PLAVIX   oxyCODONE 5 MG immediate release tablet Commonly known as: Oxy IR/ROXICODONE     TAKE these medications   apixaban 5 MG Tabs tablet Commonly known as: ELIQUIS Take 2 tablets (10 mg total) by mouth 2 (two) times daily.   apixaban 5 MG Tabs tablet Commonly known as: ELIQUIS Take 2 tablets (10mg ) twice a day for 1  day, then take 1 tablet (5 mg total) by mouth 2 (two) times daily. Start taking on: September 30, 2020   atorvastatin 40 MG tablet Commonly known as: LIPITOR Take 40 mg by mouth daily.   brimonidine 0.2 % ophthalmic solution Commonly known as: ALPHAGAN Place 1 drop into both eyes in the morning.   diltiazem 240 MG 24 hr tablet Commonly known as: CARDIZEM LA Take 240 mg by mouth in the morning.   dorzolamide-timolol 22.3-6.8 MG/ML ophthalmic solution Commonly known as: COSOPT Place 1 drop into both eyes in the morning.   escitalopram 5 MG tablet Commonly known as: LEXAPRO Take 5 mg by mouth in the morning.   gabapentin 100 MG capsule Commonly known as: NEURONTIN Take 100 mg by mouth in the morning.   levothyroxine 75 MCG  tablet Commonly known as: SYNTHROID Take 75 mcg by mouth daily before breakfast.   losartan 100 MG tablet Commonly known as: COZAAR Take 100 mg by mouth in the morning.   pantoprazole 40 MG tablet Commonly known as: PROTONIX Take 40 mg by mouth in the morning.   QUEtiapine 25 MG tablet Commonly known as: SEROQUEL Take 1 tablet (25 mg total) by mouth at bedtime.   sucralfate 1 GM/10ML suspension Commonly known as: CARAFATE Take 1 g by mouth in the morning and at bedtime.   Vitamin D (Ergocalciferol) 1.25 MG (50000 UNIT) Caps capsule Commonly known as: DRISDOL Take 1 capsule (50,000 Units total) by mouth every 7 (seven) days. Start taking on: October 01, 2020   Vitamin D3 1.25 MG (50000 UT) Caps Take 50,000 Units by mouth every Tuesday.   Zinc Oxide 12.8 % ointment Commonly known as: TRIPLE PASTE Apply topically 4 (four) times daily.            Discharge Care Instructions  (From admission, onward)         Start     Ordered   09/27/20 0000  Discharge wound care:       Comments: Discharge wound care at nursing home.   09/27/20 1059          Follow-up Information    Everlean Cherry, MD Follow up in 1 week(s).   Specialty: Family Medicine Contact information: [address] [place] [address] [phone]        [facility] Follow up in 6 week(s).   Contact information: [address]     [address] [place] [address] [phone]       Garnette Gunner, MD Follow up in 2 week(s).   Specialty: Orthopedic Surgery Contact information: [address] [place] [address] [phone]              Allergies  Allergen Reactions  . Apixaban Diarrhea  . Hydrocodone Nausea And Vomiting  . Morphine Other (See Comments)    Hallucinations  . Penicillins Other (See Comments)    "Allergic," per MAR  . Other Rash    LOBSTER     Consultations:  Neurology  Psychiatry  Orthopedics   Procedures/Studies: CT ABDOMEN PELVIS WO CONTRAST  Result Date: 09/24/2020 CLINICAL DATA:  87 year old female with bloody stool. EXAM: CT ABDOMEN AND PELVIS WITHOUT CONTRAST TECHNIQUE: Multidetector CT imaging of the abdomen and pelvis was performed following the standard protocol without IV contrast. COMPARISON:  None. FINDINGS: Evaluation of this exam is limited in the absence of intravenous contrast. Lower chest: The visualized lung bases are clear. There is calcification of the mitral  annulus. No intra-abdominal free air or free fluid. Hepatobiliary: The liver is unremarkable. No intrahepatic biliary dilatation. Small gallstone. No pericholecystic fluid or evidence of acute cholecystitis by CT. Pancreas: Unremarkable. No pancreatic ductal dilatation or surrounding inflammatory changes. Spleen: Normal in size without focal abnormality. Adrenals/Urinary Tract: The left adrenal glands unremarkable. There is a 15 mm right adrenal adenoma. Left renal upper pole parenchyma atrophy. A 2.5 cm right renal upper pole hypodense lesion which is suboptimally characterized on this noncontrast CT, likely a cyst. There is no hydronephrosis or nephrolithiasis on either side. The visualized ureters and urinary bladder appear unremarkable. Stomach/Bowel: There is moderate stool throughout the colon. There is sigmoid diverticulosis without active inflammatory changes. There is no bowel obstruction. The appendix is not visualized with certainty. No inflammatory changes identified in the right lower quadrant. Vascular/Lymphatic: Advanced aortoiliac atherosclerotic disease. The IVC is unremarkable. No portal venous gas. There is no adenopathy. Reproductive: Hysterectomy.  No adnexal masses. Other: None Musculoskeletal: Osteopenia with degenerative changes of the spine. There is a total right hip arthroplasty. No acute osseous pathology. IMPRESSION: 1. No acute  intra-abdominal or pelvic pathology. 2. Sigmoid diverticulosis. No bowel obstruction. 3. Cholelithiasis. 4. Right adrenal adenoma. 5. Aortic Atherosclerosis (ICD10-I70.0). Electronically Signed   By: Elgie Collard M.D.   On: 09/24/2020 19:36   DG Wrist Complete Left  Result Date: 09/22/2020 CLINICAL DATA:  Fall 1 week ago. EXAM: LEFT WRIST - COMPLETE 3+ VIEW COMPARISON:  12/19/2019 FINDINGS: Moderate degenerative changes of the radiocarpal joint and distal radioulnar joint as well as the carpal bones and first carpometacarpal joints. Degenerative change over the first MTP joint and interphalangeal joint. No acute fracture or dislocation. IMPRESSION: No acute findings. Electronically Signed   By: Elberta Fortis M.D.   On: 09/22/2020 15:07   DG Ankle Complete Left  Result Date: 09/22/2020 CLINICAL DATA:  Fall 1 week ago with left ankle pain. EXAM: LEFT ANKLE COMPLETE - 3+ VIEW COMPARISON:  09/04/2020 and 09/06/2020 FINDINGS: Fixation hardware unchanged in intact bridging patient's distal fibular fracture and medial malleolar fracture. Single screw unchanged bridging the distal tibia/fibular joint. Remainder of the exam is unchanged. IMPRESSION: No acute findings. Electronically Signed   By: Elberta Fortis M.D.   On: 09/22/2020 15:10   DG Abdomen 1 View  Result Date: 09/23/2020 CLINICAL DATA:  Altered mental status. EXAM: ABDOMEN - 1 VIEW COMPARISON:  None. FINDINGS: The bowel gas pattern is normal. No radio-opaque calculi or other significant radiographic abnormality are seen. IMPRESSION: Negative. Electronically Signed   By: Katherine Mantle M.D.   On: 09/23/2020 01:42   CT Head Wo Contrast  Result Date: 09/22/2020 CLINICAL DATA:  Mental status change. EXAM: CT HEAD WITHOUT CONTRAST TECHNIQUE: Contiguous axial images were obtained from the base of the skull through the vertex without intravenous contrast. COMPARISON:  September 12, 2020. FINDINGS: Brain: Evolving encephalomalacia in the right occipital and  medial temporal lobe. No definite new acute large vascular territory infarct. No acute hemorrhage. Similar remote left PCA and left PICA territory infarcts and chronic lacunar infarcts in bilateral thalami and right cerebellum. No midline shift. No hydrocephalus. Additional patchy white matter hypoattenuation, nonspecific but most likely related to chronic microvascular ischemic disease. Vascular: Calcific atherosclerosis. No hyperdense vessel identified. Skull: No acute fracture. Sinuses/Orbits: No acute findings. Other: No mastoid effusions. IMPRESSION: 1. Evolving encephalomalacia in the right occipital and medial temporal lobes, corresponding to areas of infarcts seen on recent MRI. No evidence of interval acute intracranial abnormality, although MRI could  provide more sensitive evaluation for interval acute infarct if clinically indicated. 2. Similar remote left PCA and left PICA territory infarcts and chronic lacunar infarcts in bilateral thalami and right cerebellum. Electronically Signed   By: Feliberto Harts MD   On: 09/22/2020 16:55   CT Angio Chest PE W and/or Wo Contrast  Result Date: 09/22/2020 CLINICAL DATA:  Chest pain and shortness of breath. EXAM: CT ANGIOGRAPHY CHEST WITH CONTRAST TECHNIQUE: Multidetector CT imaging of the chest was performed using the standard protocol during bolus administration of intravenous contrast. Multiplanar CT image reconstructions and MIPs were obtained to evaluate the vascular anatomy. CONTRAST:  75mL OMNIPAQUE IOHEXOL 350 MG/ML SOLN COMPARISON:  None. FINDINGS: Cardiovascular: There is moderate severity calcification of the aortic arch and descending thoracic aorta, without evidence of aneurysmal dilatation. A small amount of intraluminal low attenuation is seen within and inferior medial right upper lobe branch of the right pulmonary artery (axial CT images 69 and 70, CT series number 5). Normal heart size. No pericardial effusion. Mediastinum/Nodes: No enlarged  mediastinal, hilar, or axillary lymph nodes. Thyroid gland, trachea, and esophagus demonstrate no significant findings. Lungs/Pleura: Very mild right apical scarring and/or atelectasis is seen. There is no evidence of acute infiltrate, pleural effusion or pneumothorax. Upper Abdomen: A 1.8 cm x 1.1 cm low-attenuation right adrenal mass is seen. The left adrenal gland is nodular in appearance. Musculoskeletal: A chronic deformity is seen involving the body of the sternum. Metallic density fixation screws are seen within the right humeral head. Multilevel degenerative changes are noted throughout the thoracic spine. Chronic loss of vertebral body height is also seen involving multiple mid to lower thoracic spine vertebral bodies. Review of the MIP images confirms the above findings. IMPRESSION: 1. Small amount of pulmonary embolism within a right upper lobe branch of the right pulmonary artery. 2. Low attenuation right adrenal mass which may represent an adrenal adenoma. 3. Postoperative changes involving proximal right humerus. 4. Aortic atherosclerosis. Aortic Atherosclerosis (ICD10-I70.0). Electronically Signed   By: Aram Candela M.D.   On: 09/22/2020 22:10   MR BRAIN WO CONTRAST  Result Date: 09/23/2020 CLINICAL DATA:  87 year old female with altered mental status. 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 EXAM: MRI HEAD WITHOUT CONTRAST TECHNIQUE: Multiplanar, multiecho pulse sequences of the brain and surrounding structures were obtained without intravenous contrast. COMPARISON:  Head CT 09/22/2020. [facility] brain MRI 09/12/2020. FINDINGS: Study is intermittently degraded by motion artifact despite repeated imaging attempts. Brain: Resolved right PCA territory restricted diffusion since the prior MRI. Developing encephalomalacia in the medial right occipital lobe now. Contralateral chronic left PCA territory encephalomalacia. Punctate residual abnormal diffusion in the posterior right corona  radiata which was present previously. No new areas of abnormal diffusion. No midline shift, mass effect, evidence of mass lesion, ventriculomegaly, extra-axial collection or acute intracranial hemorrhage. Cervicomedullary junction within normal limits. Punctate T1 hyperintense area along the pituitary infundibulum appears stable and benign. Patchy and scattered bilateral cerebral white matter T2 and FLAIR hyperintensity is stable. Moderate similar T2 heterogeneity in the pons. Chronic left thalamic and bilateral cerebellar infarcts are stable. No significant chronic blood products. No new signal abnormality. Vascular: Major intracranial vascular flow voids appear stable from last month. Skull and upper cervical spine: Stable and negative. Sinuses/Orbits: Stable and negative. Other: Mastoids remain clear. IMPRESSION: 1. No acute intracranial abnormality. 2. Expected evolution of the Right PCA territory infarct seen last month. And fading small white matter lacunar infarct in the posterior right corona radiata. 3. Underlying chronic  white matter disease, chronic left PCA and bilateral cerebellar territory infarcts. Electronically Signed   By: Odessa Fleming M.D.   On: 09/23/2020 04:30   DG Chest Port 1 View  Result Date: 09/22/2020 CLINICAL DATA:  Evaluate for pneumonia. EXAM: PORTABLE CHEST 1 VIEW COMPARISON:  09/04/2020 FINDINGS: Lungs are adequately inflated without focal airspace consolidation, effusion or pneumothorax. Cardiomediastinal silhouette and remainder of the exam is unchanged. IMPRESSION: No active disease. Electronically Signed   By: Elberta Fortis M.D.   On: 09/22/2020 15:06   DG Ankle Left Port  Result Date: 09/26/2020 CLINICAL DATA:  Closed left ankle fracture. EXAM: PORTABLE LEFT ANKLE - 2 VIEW COMPARISON:  September 22, 2020. FINDINGS: The left ankle has been splinted and immobilized. Status post surgical internal fixation of the left distal fibular and tibial fractures. This is unchanged compared to  prior exam. No new fracture is noted. IMPRESSION: Status post splinting of the left ankle. Postsurgical changes are again noted. Electronically Signed   By: Lupita Raider M.D.   On: 09/26/2020 11:05   ECHOCARDIOGRAM COMPLETE  Result Date: 09/23/2020    ECHOCARDIOGRAM REPORT   Patient Name:   Tammy Jones Date of Exam: 09/23/2020 Medical Rec #:  [id number]        Height:       64.0 in Accession #:    [id number]       Weight:       165.0 lb Date of Birth:  May 13, 1932        BSA:          1.802 m Patient Age:    89 years         BP:           164/84 mmHg Patient Gender: F                HR:           64 bpm. Exam Location:  Inpatient Procedure: 2D Echo, Cardiac Doppler and Color Doppler Indications:    Pulmonary embolus  History:        Patient has no prior history of Echocardiogram examinations.                 Stroke, Arrythmias:Atrial Fibrillation; Risk                 Factors:Hypertension and Dyslipidemia.  Sonographer:    Neomia Dear RDCS Referring Phys: [id number] JARED M GARDNER IMPRESSIONS  1. Abnormal septal motion ? from LBBB. Left ventricular ejection fraction, by estimation, is 50 to 55%. The left ventricle has low normal function. The left ventricle has no regional wall motion abnormalities. There is mild left ventricular hypertrophy.  Left ventricular diastolic parameters are indeterminate.  2. Right ventricular systolic function is normal. The right ventricular size is normal. There is normal pulmonary artery systolic pressure.  3. Left atrial size was moderately dilated.  4. The mitral valve is degenerative. No evidence of mitral valve regurgitation. No evidence of mitral stenosis. Severe mitral annular calcification.  5. The aortic valve was not well visualized. There is moderate calcification of the aortic valve. Aortic valve regurgitation is trivial. Mild to moderate aortic valve sclerosis/calcification is present, without any evidence of aortic stenosis.  6. The inferior vena cava is normal in size  with greater than 50% respiratory variability, suggesting right atrial pressure of 3 mmHg. FINDINGS  Left Ventricle: Abnormal septal motion ? from LBBB. Left ventricular ejection fraction, by estimation, is 50 to 55%. The left  ventricle has low normal function. The left ventricle has no regional wall motion abnormalities. The left ventricular internal cavity size was normal in size. There is mild left ventricular hypertrophy. Left ventricular diastolic parameters are indeterminate. Right Ventricle: The right ventricular size is normal. No increase in right ventricular wall thickness. Right ventricular systolic function is normal. There is normal pulmonary artery systolic pressure. The tricuspid regurgitant velocity is 2.01 m/s, and  with an assumed right atrial pressure of 3 mmHg, the estimated right ventricular systolic pressure is 19.2 mmHg. Left Atrium: Left atrial size was moderately dilated. Right Atrium: Right atrial size was normal in size. Pericardium: There is no evidence of pericardial effusion. Mitral Valve: The mitral valve is degenerative in appearance. Severe mitral annular calcification. No evidence of mitral valve regurgitation. No evidence of mitral valve stenosis. MV peak gradient, 6.0 mmHg. The mean mitral valve gradient is 2.0 mmHg. Tricuspid Valve: The tricuspid valve is normal in structure. Tricuspid valve regurgitation is not demonstrated. No evidence of tricuspid stenosis. Aortic Valve: The aortic valve was not well visualized. There is moderate calcification of the aortic valve. Aortic valve regurgitation is trivial. Aortic regurgitation PHT measures 585 msec. Mild to moderate aortic valve sclerosis/calcification is present, without any evidence of aortic stenosis. Aortic valve mean gradient measures 4.0 mmHg. Aortic valve peak gradient measures 6.9 mmHg. Aortic valve area, by VTI measures 2.48 cm. Pulmonic Valve: The pulmonic valve was normal in structure. Pulmonic valve regurgitation is  not visualized. No evidence of pulmonic stenosis. Aorta: The aortic root is normal in size and structure. Venous: The inferior vena cava is normal in size with greater than 50% respiratory variability, suggesting right atrial pressure of 3 mmHg. IAS/Shunts: No atrial level shunt detected by color flow Doppler.  LEFT VENTRICLE PLAX 2D LVIDd:         4.20 cm     Diastology LVIDs:         2.40 cm     LV e' medial:    4.29 cm/s LV PW:         1.50 cm     LV E/e' medial:  22.1 LV IVS:        1.20 cm     LV e' lateral:   5.17 cm/s LVOT diam:     2.00 cm     LV E/e' lateral: 18.3 LV SV:         65 LV SV Index:   36 LVOT Area:     3.14 cm  LV Volumes (MOD) LV vol d, MOD A2C: 21.6 ml LV vol d, MOD A4C: 79.9 ml LV vol s, MOD A2C: 12.3 ml LV vol s, MOD A4C: 37.5 ml LV SV MOD A2C:     9.3 ml LV SV MOD A4C:     79.9 ml LV SV MOD BP:      20.7 ml RIGHT VENTRICLE TAPSE (M-mode): 1.3 cm  PULMONARY VEINS                         A Reversal Duration: 120.00 msec                         A Reversal Velocity: 20.70 cm/s                         Diastolic Velocity:  22.60 cm/s  S/D Velocity:        1.70                         Systolic Velocity:   39.10 cm/s LEFT ATRIUM             Index       RIGHT ATRIUM           Index LA diam:        4.50 cm 2.50 cm/m  RA Area:     12.70 cm LA Vol (A2C):   53.2 ml 29.52 ml/m RA Volume:   26.30 ml  14.59 ml/m LA Vol (A4C):   58.2 ml 32.29 ml/m LA Biplane Vol: 56.6 ml 31.40 ml/m  AORTIC VALVE                   PULMONIC VALVE AV Area (Vmax):    2.71 cm    PV Vmax:       0.61 m/s AV Area (Vmean):   2.48 cm    PV Vmean:      51.900 cm/s AV Area (VTI):     2.48 cm    PV VTI:        0.160 m AV Vmax:           131.50 cm/s PV Peak grad:  1.5 mmHg AV Vmean:          89.500 cm/s PV Mean grad:  1.0 mmHg AV VTI:            0.262 m AV Peak Grad:      6.9 mmHg AV Mean Grad:      4.0 mmHg LVOT Vmax:         113.50 cm/s LVOT Vmean:        70.550 cm/s LVOT VTI:          0.207 m LVOT/AV  VTI ratio: 0.79 AI PHT:            585 msec  AORTA Ao Root diam: 3.20 cm Ao Asc diam:  2.60 cm MITRAL VALVE                TRICUSPID VALVE MV Area (PHT): 2.48 cm     TR Peak grad:   16.2 mmHg MV Area VTI:   1.66 cm     TR Vmax:        201.00 cm/s MV Peak grad:  6.0 mmHg MV Mean grad:  2.0 mmHg     SHUNTS MV Vmax:       1.22 m/s     Systemic VTI:  0.21 m MV Vmean:      71.6 cm/s    Systemic Diam: 2.00 cm MV Decel Time: 306 msec MV E velocity: 94.70 cm/s MV A velocity: 125.00 cm/s MV E/A ratio:  0.76 Charlton Haws MD Electronically signed by Charlton Haws MD Signature Date/Time: 09/23/2020/10:45:37 AM    Final    VAS [place] LOWER EXTREMITY VENOUS (DVT) ([facility] and [facility] 7a-7p)  Result Date: 09/22/2020  Lower Venous DVT Study Indications: Pain and swelling in LLE. one week s/p fall and fracture.  Limitations: Patient intolerance to compressions/pain at calf. Comparison Study: No prior studies. Performing Technologist: Jean Rosenthal RDMS,RVT  Examination Guidelines: A complete evaluation includes B-mode imaging, spectral Doppler, color Doppler, and power Doppler as needed of all accessible portions of each vessel. Bilateral testing is considered an integral part of a complete examination. Limited examinations for reoccurring indications may be  performed as noted. The reflux portion of the exam is performed with the patient in reverse Trendelenburg.  +-----+---------------+---------+-----------+----------+--------------+ RIGHTCompressibilityPhasicitySpontaneityPropertiesThrombus Aging +-----+---------------+---------+-----------+----------+--------------+ CFV  Full           Yes      Yes                                 +-----+---------------+---------+-----------+----------+--------------+   +---------+---------------+---------+-----------+----------+-------------------+ LEFT     CompressibilityPhasicitySpontaneityPropertiesThrombus Aging       +---------+---------------+---------+-----------+----------+-------------------+ CFV      Full           Yes      Yes                                      +---------+---------------+---------+-----------+----------+-------------------+ SFJ      Full                                                             +---------+---------------+---------+-----------+----------+-------------------+ FV Prox  Full                                                             +---------+---------------+---------+-----------+----------+-------------------+ FV Mid   Full                                                             +---------+---------------+---------+-----------+----------+-------------------+ FV DistalFull                                                             +---------+---------------+---------+-----------+----------+-------------------+ PFV      Full                                                             +---------+---------------+---------+-----------+----------+-------------------+ POP      Full           Yes      Yes                                      +---------+---------------+---------+-----------+----------+-------------------+ PTV      Partial                                      Age Indeterminate   +---------+---------------+---------+-----------+----------+-------------------+ PERO  Not well visualized +---------+---------------+---------+-----------+----------+-------------------+     Summary: RIGHT: - No evidence of common femoral vein obstruction.  - Ultrasound characteristics of enlarged lymph nodes are noted in the groin.  LEFT: - Findings consistent with age indeterminate deep vein thrombosis involving a single left posterior tibial vein.  - No cystic structure found in the popliteal fossa.  *See table(s) above for measurements and observations. Electronically signed by  Sherald Hess MD on 09/22/2020 at 4:25:48 PM.    Final       Subjective: Seen and examined at bedside.  Overnight events noted.  Patient reports feeling much improved.  Patient is being discharged to skilled nursing facility today.  Discharge Exam: Vitals:   09/27/20 0547 09/27/20 0800  BP: (!) 145/70 106/65  Pulse: 70 (!) 58  Resp: 20 18  Temp: 98.3 F (36.8 C) 98.1 F (36.7 C)  SpO2: 97% 100%   Vitals:   09/26/20 2039 09/27/20 0547 09/27/20 0632 09/27/20 0800  BP: 138/64 (!) 145/70  106/65  Pulse: 65 70  (!) 58  Resp: 20 20  18   Temp: 98 F (36.7 C) 98.3 F (36.8 C)  98.1 F (36.7 C)  TempSrc: Oral Oral  Oral  SpO2: 98% 97%  100%  Weight:   80 kg     General: Pt is alert, awake, not in acute distress Cardiovascular: RRR, S1/S2 +, no rubs, no gallops Respiratory: CTA bilaterally, no wheezing, no rhonchi Abdominal: Soft, NT, ND, bowel sounds + Extremities: no edema, no cyanosis, Left foot in cast.    The results of significant diagnostics from this hospitalization (including imaging, microbiology, ancillary and laboratory) are listed below for reference.     Microbiology: Recent Results (from the past 240 hour(s))  Urine culture     Status: Abnormal   Collection Time: 09/22/20  8:56 PM   Specimen: Urine, Clean Catch  Result Value Ref Range Status   Specimen Description URINE, CLEAN CATCH  Final   Special Requests NONE  Final   Culture (A)  Final    >=100,000 COLONIES/mL GLOBICATELLA SANGUINIS Standardized susceptibility testing for this organism is not available. Performed at [facility], [address] [place], [place]    Report Status 09/25/2020 FINAL  Final  SARS CORONAVIRUS 2 (TAT 6-24 HRS) Nasopharyngeal Nasopharyngeal Swab     Status: None   Collection Time: 09/23/20 12:18 AM   Specimen: Nasopharyngeal Swab  Result Value Ref Range Status   SARS Coronavirus 2 NEGATIVE NEGATIVE Final    Comment: (NOTE) SARS-CoV-2 target nucleic  acids are NOT DETECTED.  The SARS-CoV-2 RNA is generally detectable in upper and lower respiratory specimens during the acute phase of infection. Negative results do not preclude SARS-CoV-2 infection, do not rule out co-infections with other pathogens, and should not be used as the sole basis for treatment or other patient management decisions. Negative results must be combined with clinical observations, patient history, and epidemiological information. The expected result is Negative.  Fact Sheet for Patients: 11/23/20  Fact Sheet for Healthcare Providers: HairSlick.no  This test is not yet approved or cleared by the [place] FDA and  has been authorized for detection and/or diagnosis of SARS-CoV-2 by FDA under an Emergency Use Authorization (EUA). This EUA will remain  in effect (meaning this test can be used) for the duration of the COVID-19 declaration under Se ction 564(b)(1) of the Act, 21 U.S.C. section 360bbb-3(b)(1), unless the authorization is terminated or revoked sooner.  Performed at [facility], [address], [place], [place] [address]      Labs: BNP (last 3 results) No results for input(s): BNP in the last 8760 hours. Basic Metabolic Panel: Recent Labs  Lab 09/22/20 1352 09/23/20 0410 09/24/20 0340 09/25/20 0234 09/26/20 0033  NA 135 135 134* 137 136  K 3.6 3.5 3.8 3.6 3.7  CL 105 105 104 106 107  CO2 23 23 23 24 22   GLUCOSE 125* 110* 96 110* 105*  BUN 12 11 19 18 17   CREATININE 0.88 0.88 1.08* 1.03* 0.90  CALCIUM 8.1* 8.3* 8.1* 8.0* 8.1*  MG  --   --  2.2  --  2.1  PHOS  --   --  3.9  --  3.9   Liver Function Tests: Recent Labs  Lab 09/22/20 1352  AST 32  ALT 30  ALKPHOS 97  BILITOT 0.7  PROT 6.1*  ALBUMIN 2.1*   No results for input(s): LIPASE, AMYLASE in the last 168 hours. No results for input(s): AMMONIA in the last 168 hours. CBC: Recent Labs  Lab  09/22/20 1352 09/23/20 0410 09/24/20 0340 09/24/20 1817 09/25/20 0234 09/26/20 0033 09/27/20 0131  WBC 6.8 6.2 5.2  --   --  5.1  --   NEUTROABS 5.0  --   --   --   --   --   --   HGB 9.3* 8.9* 8.5* 9.1* 8.6* 8.6* 8.3*  HCT 29.0* 27.9* 26.7* 29.6* 27.0* 27.2* 27.1*  MCV 83.1 83.5 83.7  --   --  82.9  --   PLT 345 321 368  --   --  376  --    Cardiac Enzymes: No results for input(s): CKTOTAL, CKMB, CKMBINDEX, TROPONINI in the last 168 hours. BNP: Invalid input(s): POCBNP CBG: Recent Labs  Lab 09/22/20 1251  GLUCAP 125*   D-Dimer No results for input(s): DDIMER in the last 72 hours. Hgb A1c No results for input(s): HGBA1C in the last 72 hours. Lipid Profile No results for input(s): CHOL, HDL, LDLCALC, TRIG, CHOLHDL, LDLDIRECT in the last 72 hours. Thyroid function studies No results for input(s): TSH, T4TOTAL, T3FREE, THYROIDAB in the last 72 hours.  Invalid input(s): FREET3 Anemia work up No results for input(s): VITAMINB12, FOLATE, FERRITIN, TIBC, IRON, RETICCTPCT in the last 72 hours. Urinalysis    Component Value Date/Time   COLORURINE YELLOW 09/22/2020 1937   APPEARANCEUR CLEAR 09/22/2020 1937   LABSPEC 1.018 09/22/2020 1937   PHURINE 6.0 09/22/2020 1937   GLUCOSEU NEGATIVE 09/22/2020 1937   HGBUR SMALL (A) 09/22/2020 1937   BILIRUBINUR NEGATIVE 09/22/2020 1937   KETONESUR NEGATIVE 09/22/2020 1937   PROTEINUR NEGATIVE 09/22/2020 1937   NITRITE NEGATIVE 09/22/2020 1937   LEUKOCYTESUR TRACE (A) 09/22/2020 1937   Sepsis Labs Invalid input(s): PROCALCITONIN,  WBC,  LACTICIDVEN Microbiology Recent Results (from the past 240 hour(s))  Urine culture     Status: Abnormal   Collection Time: 09/22/20  8:56 PM   Specimen: Urine, Clean Catch  Result Value Ref Range Status   Specimen Description URINE, CLEAN CATCH  Final   Special Requests NONE  Final   Culture (A)  Final    >=100,000 COLONIES/mL GLOBICATELLA SANGUINIS Standardized susceptibility testing for this  organism is not available. Performed at [facility], [address], [place], [address] [place]    Report Status 09/25/2020 FINAL  Final  SARS CORONAVIRUS 2 (TAT 6-24 HRS) Nasopharyngeal Nasopharyngeal Swab     Status: None   Collection Time: 09/23/20 12:18  AM   Specimen: Nasopharyngeal Swab  Result Value Ref Range Status   SARS Coronavirus 2 NEGATIVE NEGATIVE Final    Comment: (NOTE) SARS-CoV-2 target nucleic acids are NOT DETECTED.  The SARS-CoV-2 RNA is generally detectable in upper and lower respiratory specimens during the acute phase of infection. Negative results do not preclude SARS-CoV-2 infection, do not rule out co-infections with other pathogens, and should not be used as the sole basis for treatment or other patient management decisions. Negative results must be combined with clinical observations, patient history, and epidemiological information. The expected result is Negative.  Fact Sheet for Patients: HairSlick.nohttps://www.fda.gov/media/138098/download  Fact Sheet for Healthcare Providers: quierodirigir.comhttps://www.fda.gov/media/138095/download  This test is not yet approved or cleared by the [place] FDA and  has been authorized for detection and/or diagnosis of SARS-CoV-2 by FDA under an Emergency Use Authorization (EUA). This EUA will remain  in effect (meaning this test can be used) for the duration of the COVID-19 declaration under Se ction 564(b)(1) of the Act, 21 U.S.C. section 360bbb-3(b)(1), unless the authorization is terminated or revoked sooner.  Performed at [facility], [address] [place], [place] [address]      Time coordinating discharge: Over 30 minutes  SIGNED:   Cipriano BunkerPARDEEP Javin Nong, MD  [facility] 09/27/2020, 11:41 AM Pager   If 7PM-7AM, please contact night-coverage [email or web address]

## 2020-11-06 ENCOUNTER — Ambulatory Visit: Payer: Medicare Other | Admitting: Sports Medicine

## 2022-03-08 IMAGING — MR MR HEAD W/O CM
7 of 12 series · 23 of 48 positions shown · non-contrast
Comparison: Head CT 09/22/2020. Kateryna Sly brain MRI
09/12/2020.

CLINICAL DATA: 89-year-old female with altered mental status.
Subacute right PCA infarct last month.

EXAM:
MRI HEAD WITHOUT CONTRAST
TECHNIQUE: Multiplanar, multiecho pulse sequences of the brain and surrounding
structures were obtained without intravenous contrast.

[Series 2: DWI · axial · 3.0mm · 0.94mm/px · z∈[-103,+43]mm · 7 of 100 slices shown (1 of 2)]
[im 1/100]
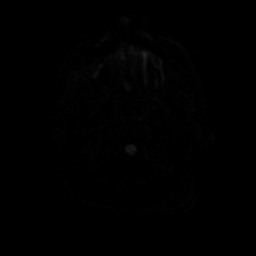
[im 17/100]
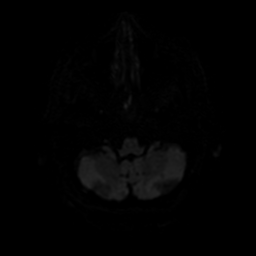
[im 34/100]
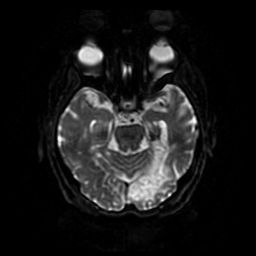
[im 50/100]
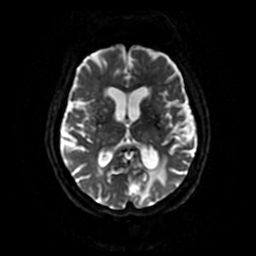
[im 67/100]
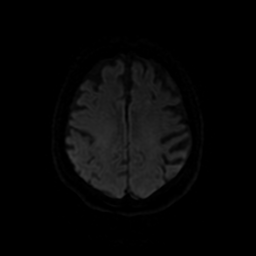
[im 83/100]
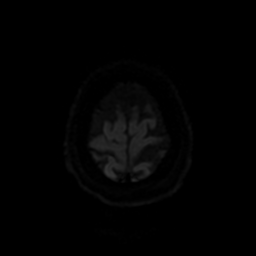
[im 100/100]
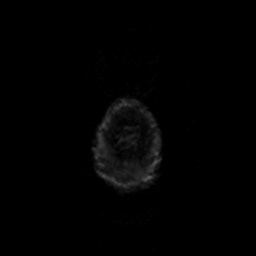

[Series 3: DWI · coronal · 4.0mm · 0.94mm/px · 4 of 68 slices shown (2 of 2)]
[im 1/68]
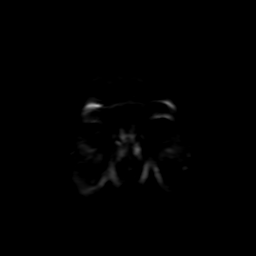
[im 23/68]
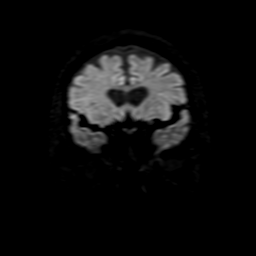
[im 45/68]
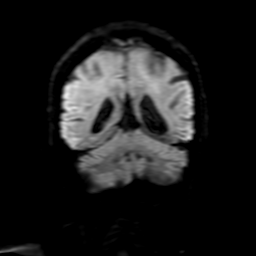
[im 68/68]
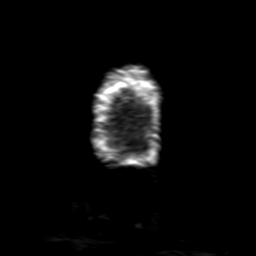

[Series 4: FLAIR · sagittal · 5.0mm · 0.23mm/px · 2 of 23 slices shown (1 of 2)]
[im 1/23]
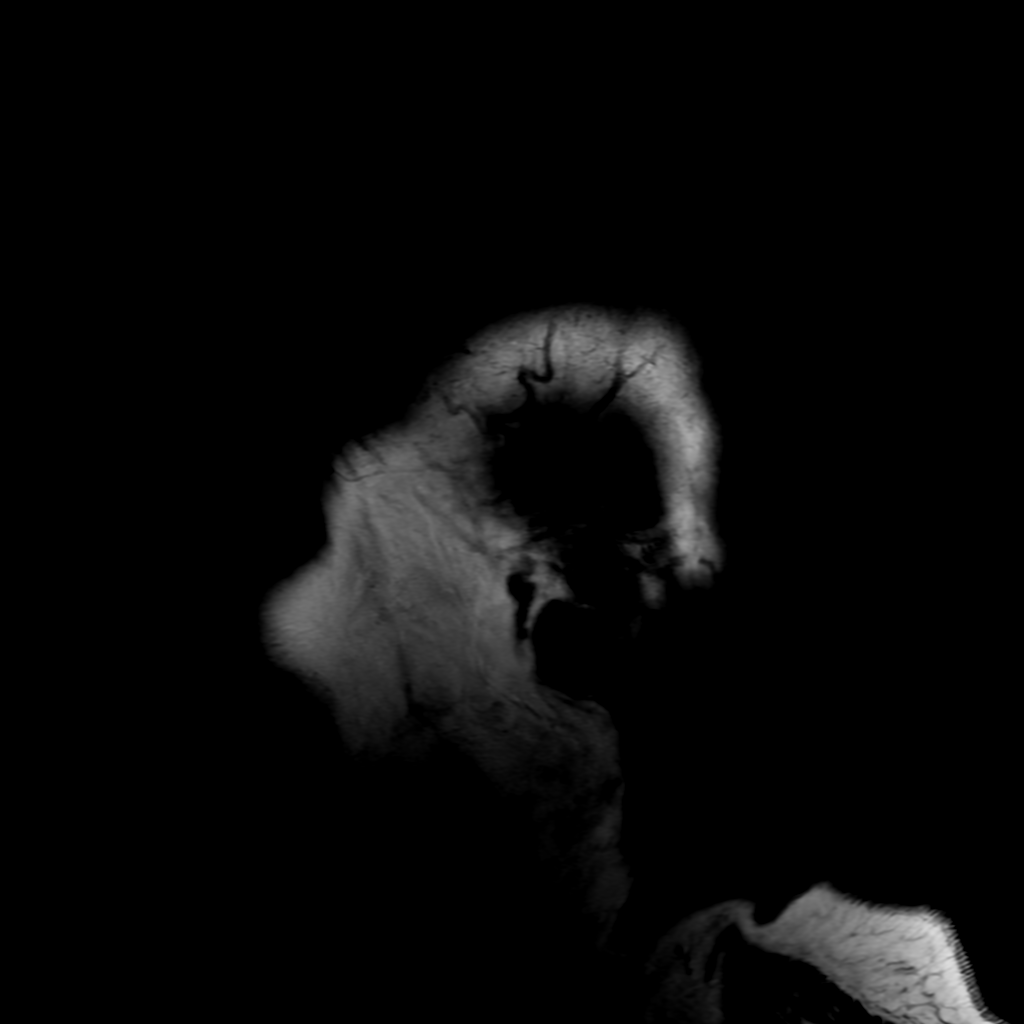
[im 23/23]
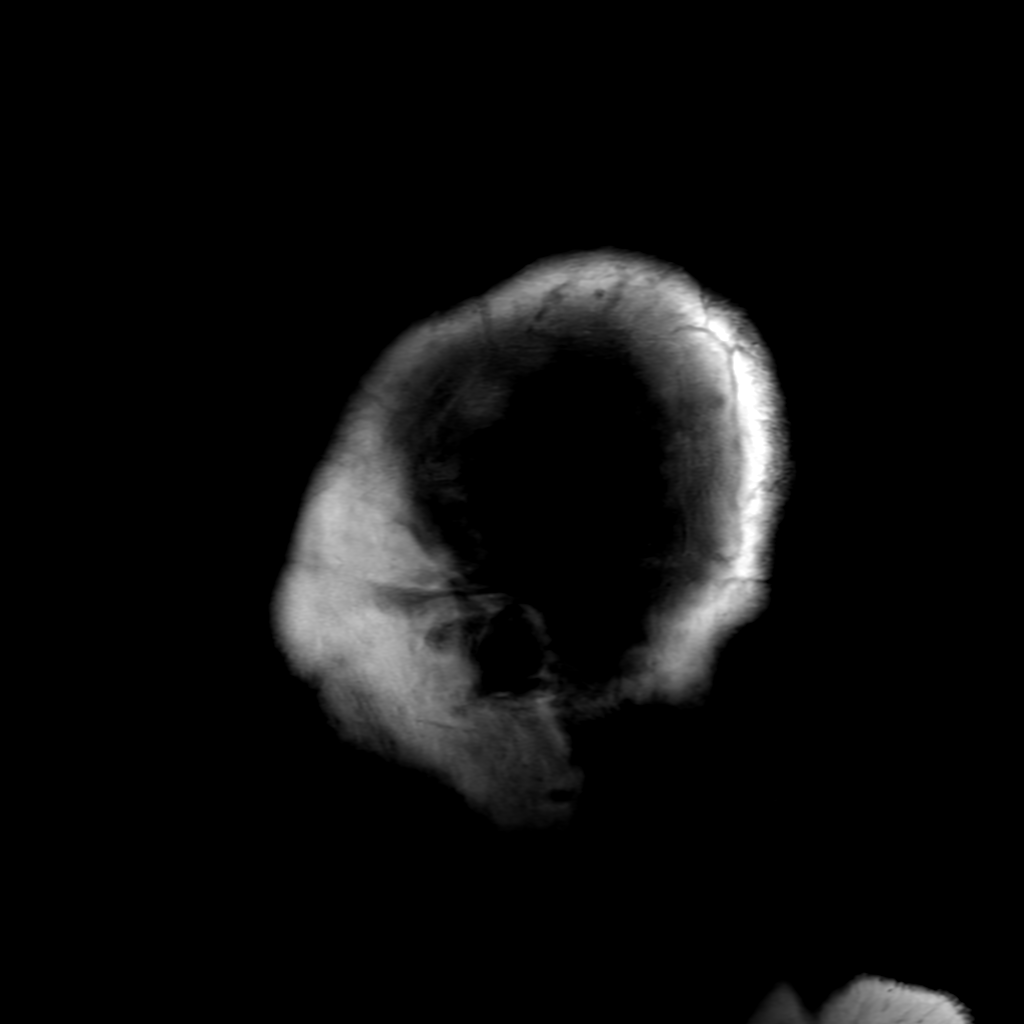

[Series 5: T2 · axial · 5.0mm · 0.23mm/px · 1 of 26 slices shown]
[im 1/26]
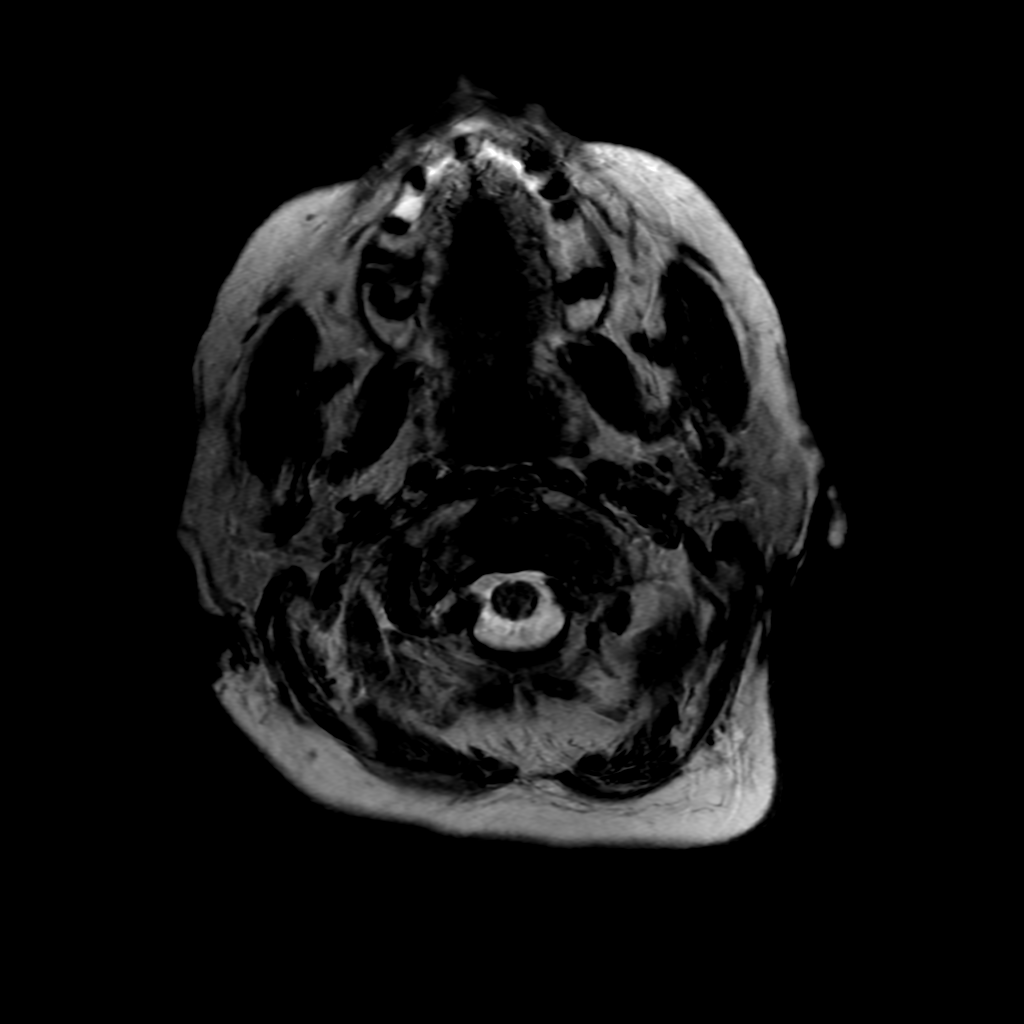

[Series 6: FLAIR · axial · 3.0mm · 0.45mm/px · z∈[-98,+39]mm · 2 of 24 slices shown (2 of 2)]
[im 1/24]
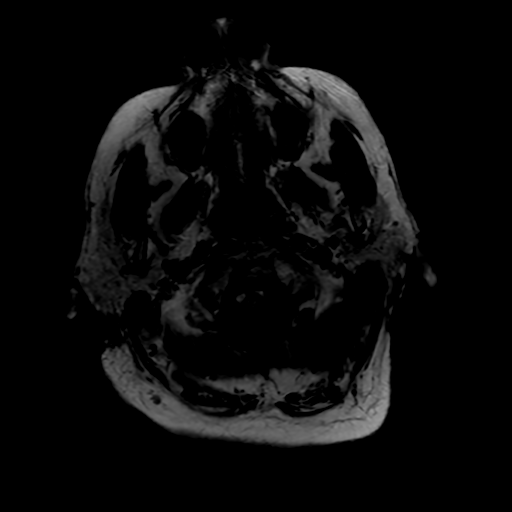
[im 24/24]
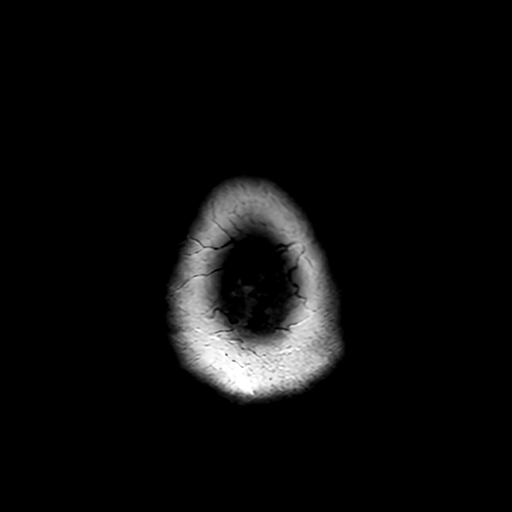

[Series 250: ADC · axial · 3.0mm · 0.94mm/px · z∈[-103,+43]mm · 4 of 50 slices shown (1 of 2)]
[im 1/50]
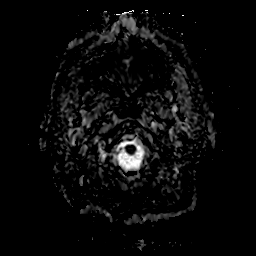
[im 17/50]
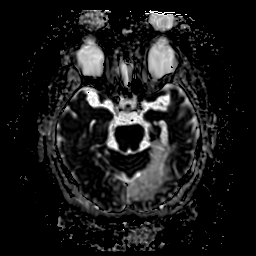
[im 33/50]
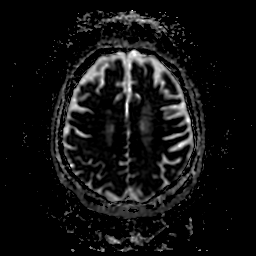
[im 50/50]
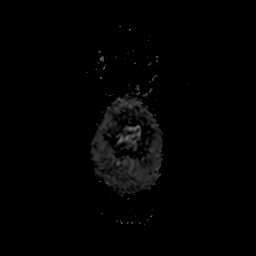

[Series 350: ADC · coronal · 4.0mm · 0.94mm/px · 3 of 34 slices shown (2 of 2)]
[im 1/34]
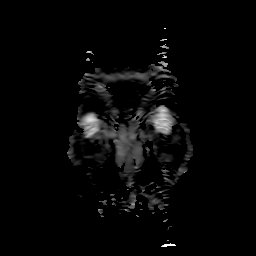
[im 17/34]
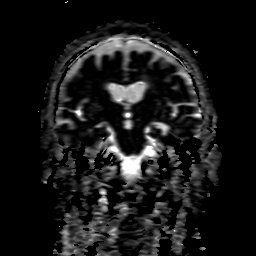
[im 34/34]
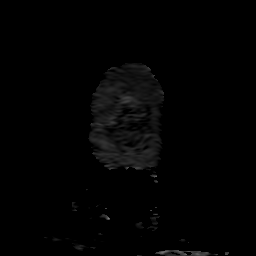

[23 of 48 positions shown; findings below may reference images not displayed]

FINDINGS: Study is intermittently degraded by motion artifact despite repeated
imaging attempts.

Brain: Resolved right PCA territory restricted diffusion since the
prior MRI. Developing encephalomalacia in the medial right occipital
lobe now. Contralateral chronic left PCA territory encephalomalacia.

Punctate residual abnormal diffusion in the posterior right corona
radiata which was present previously.

No new areas of abnormal diffusion. No midline shift, mass effect,
evidence of mass lesion, ventriculomegaly, extra-axial collection or
acute intracranial hemorrhage. Cervicomedullary junction within
normal limits.

Punctate T1 hyperintense area along the pituitary infundibulum
appears stable and benign.

Patchy and scattered bilateral cerebral white matter T2 and FLAIR
hyperintensity is stable. Moderate similar T2 heterogeneity in the
pons. Chronic left thalamic and bilateral cerebellar infarcts are
stable. No significant chronic blood products. No new signal
abnormality.

Vascular: Major intracranial vascular flow voids appear stable from
last month.

Skull and upper cervical spine: Stable and negative.

Sinuses/Orbits: Stable and negative.

Other: Mastoids remain clear.
IMPRESSION: 1. No acute intracranial abnormality.
2. Expected evolution of the Right PCA territory infarct seen last
month. And fading small white matter lacunar infarct in the
posterior right corona radiata.
3. Underlying chronic white matter disease, chronic left PCA and
bilateral cerebellar territory infarcts.

## 2022-03-11 IMAGING — DX DG ANKLE PORT 2V*L*
1 series · 3 of 3 positions shown · non-contrast
Comparison: September 22, 2020.

CLINICAL DATA: Closed left ankle fracture.

EXAM:
PORTABLE LEFT ANKLE - 2 VIEW

[Series 1: ankle · 0.14mm/px · 3 of 3 slices shown]
[im 1/3]
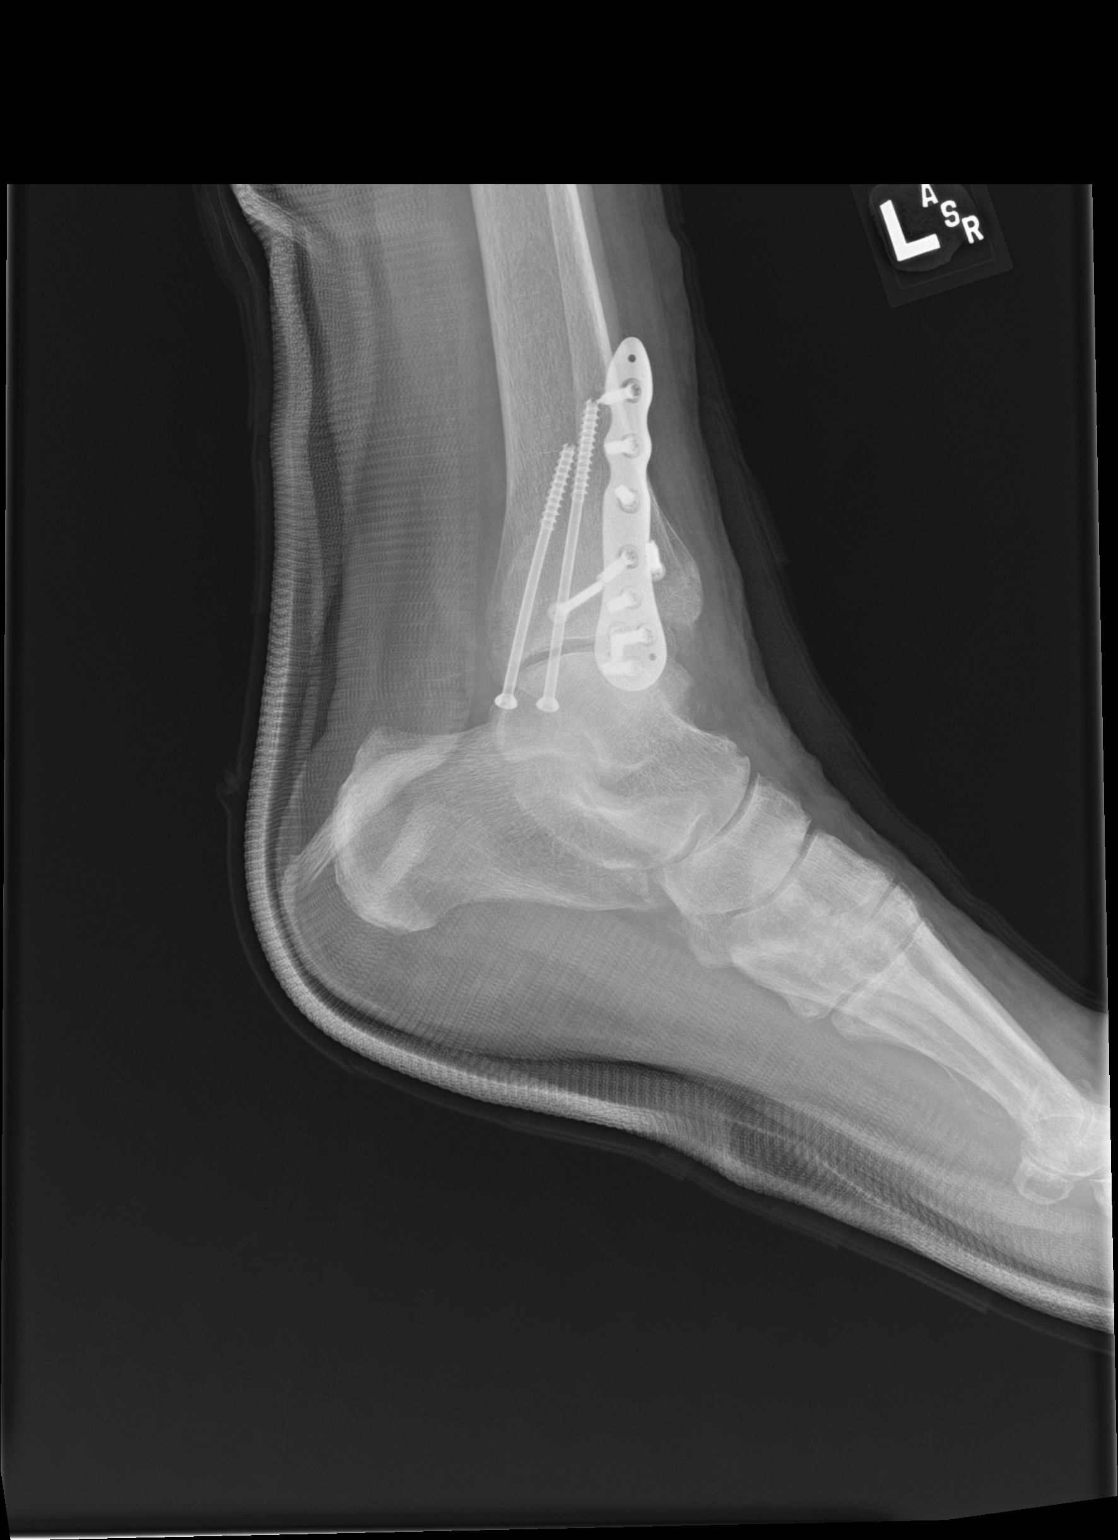
[im 2/3]
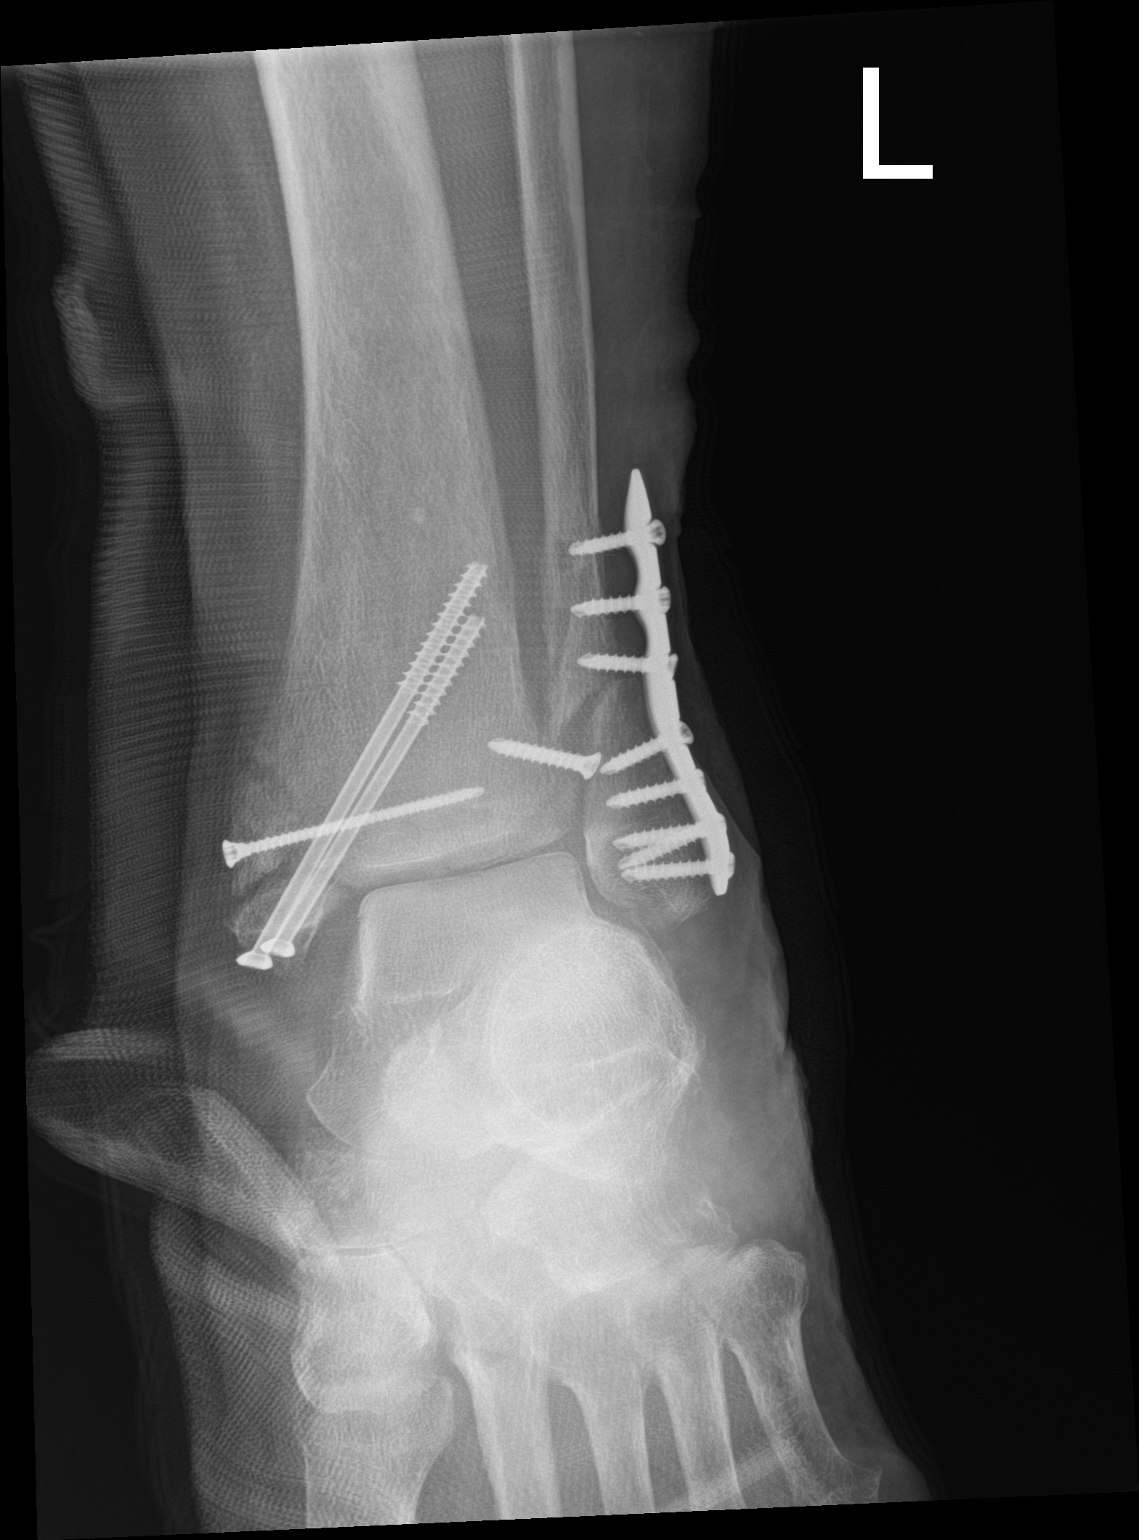
[im 3/3]
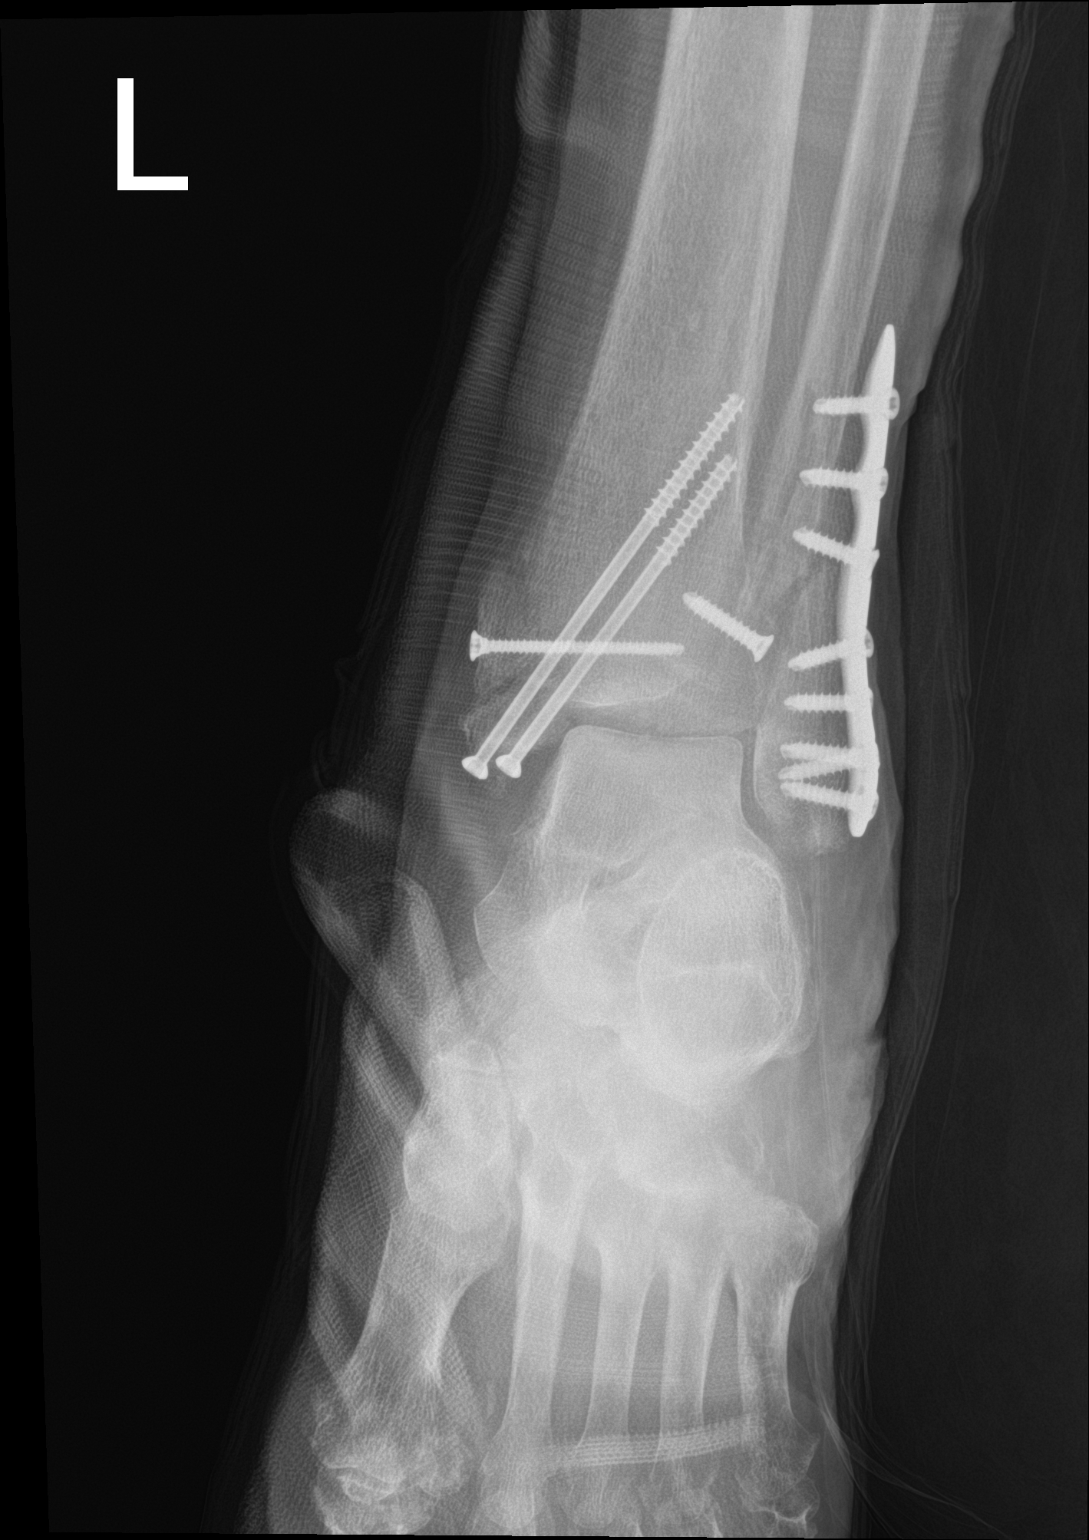

[3 of 3 positions shown; findings below may reference images not displayed]

FINDINGS: The left ankle has been splinted and immobilized. Status post
surgical internal fixation of the left distal fibular and tibial
fractures. This is unchanged compared to prior exam. No new fracture
is noted.
IMPRESSION: Status post splinting of the left ankle. Postsurgical changes are
again noted.
